# Patient Record
Sex: Female | Born: 1950 | Race: White | Hispanic: No | Marital: Married | Smoking: Former smoker
Health system: Southern US, Community
[De-identification: ages and names within clinical notes are randomized; demographics above are authoritative.]

## PROBLEM LIST (undated history)

## (undated) DIAGNOSIS — I471 Supraventricular tachycardia, unspecified: Secondary | ICD-10-CM

## (undated) DIAGNOSIS — R51 Headache: Secondary | ICD-10-CM

## (undated) DIAGNOSIS — G8929 Other chronic pain: Secondary | ICD-10-CM

## (undated) DIAGNOSIS — K5900 Constipation, unspecified: Secondary | ICD-10-CM

## (undated) DIAGNOSIS — G894 Chronic pain syndrome: Secondary | ICD-10-CM

## (undated) DIAGNOSIS — R112 Nausea with vomiting, unspecified: Secondary | ICD-10-CM

## (undated) DIAGNOSIS — C801 Malignant (primary) neoplasm, unspecified: Secondary | ICD-10-CM

## (undated) DIAGNOSIS — M545 Low back pain, unspecified: Secondary | ICD-10-CM

## (undated) DIAGNOSIS — T4145XA Adverse effect of unspecified anesthetic, initial encounter: Secondary | ICD-10-CM

## (undated) DIAGNOSIS — E079 Disorder of thyroid, unspecified: Secondary | ICD-10-CM

## (undated) DIAGNOSIS — E785 Hyperlipidemia, unspecified: Secondary | ICD-10-CM

## (undated) DIAGNOSIS — I10 Essential (primary) hypertension: Secondary | ICD-10-CM

## (undated) DIAGNOSIS — T7840XA Allergy, unspecified, initial encounter: Secondary | ICD-10-CM

## (undated) DIAGNOSIS — R918 Other nonspecific abnormal finding of lung field: Secondary | ICD-10-CM

## (undated) DIAGNOSIS — K219 Gastro-esophageal reflux disease without esophagitis: Secondary | ICD-10-CM

## (undated) DIAGNOSIS — G4700 Insomnia, unspecified: Secondary | ICD-10-CM

## (undated) DIAGNOSIS — T8859XA Other complications of anesthesia, initial encounter: Secondary | ICD-10-CM

## (undated) DIAGNOSIS — Z9889 Other specified postprocedural states: Secondary | ICD-10-CM

## (undated) DIAGNOSIS — K589 Irritable bowel syndrome without diarrhea: Secondary | ICD-10-CM

## (undated) DIAGNOSIS — K649 Unspecified hemorrhoids: Secondary | ICD-10-CM

## (undated) DIAGNOSIS — Z8489 Family history of other specified conditions: Secondary | ICD-10-CM

## (undated) HISTORY — DX: Other chronic pain: G89.29

## (undated) HISTORY — DX: Irritable bowel syndrome, unspecified: K58.9

## (undated) HISTORY — DX: Supraventricular tachycardia: I47.1

## (undated) HISTORY — PX: TONSILLECTOMY: SUR1361

## (undated) HISTORY — PX: KNEE ARTHROSCOPY: SUR90

## (undated) HISTORY — PX: DILATION AND CURETTAGE OF UTERUS: SHX78

## (undated) HISTORY — DX: Insomnia, unspecified: G47.00

## (undated) HISTORY — DX: Chronic pain syndrome: G89.4

## (undated) HISTORY — DX: Malignant (primary) neoplasm, unspecified: C80.1

## (undated) HISTORY — DX: Headache: R51

## (undated) HISTORY — DX: Disorder of thyroid, unspecified: E07.9

## (undated) HISTORY — DX: Hypercalcemia: E83.52

## (undated) HISTORY — DX: Allergy, unspecified, initial encounter: T78.40XA

## (undated) HISTORY — DX: Low back pain: M54.5

## (undated) HISTORY — PX: OTHER SURGICAL HISTORY: SHX169

## (undated) HISTORY — DX: Essential (primary) hypertension: I10

## (undated) HISTORY — DX: Hyperlipidemia, unspecified: E78.5

## (undated) HISTORY — DX: Other nonspecific abnormal finding of lung field: R91.8

## (undated) HISTORY — DX: Supraventricular tachycardia, unspecified: I47.10

## (undated) HISTORY — DX: Low back pain, unspecified: M54.50

---

## 2010-04-29 ENCOUNTER — Emergency Department (HOSPITAL_COMMUNITY): Admission: EM | Admit: 2010-04-29 | Discharge: 2010-04-29 | Payer: Self-pay | Admitting: Emergency Medicine

## 2010-06-16 DIAGNOSIS — J069 Acute upper respiratory infection, unspecified: Secondary | ICD-10-CM | POA: Insufficient documentation

## 2010-06-16 DIAGNOSIS — M545 Low back pain, unspecified: Secondary | ICD-10-CM | POA: Insufficient documentation

## 2011-08-21 ENCOUNTER — Emergency Department (HOSPITAL_COMMUNITY)
Admission: EM | Admit: 2011-08-21 | Discharge: 2011-08-21 | Disposition: A | Discharge status: Home / Self Care | Payer: No Typology Code available for payment source | Attending: Emergency Medicine | Admitting: Emergency Medicine

## 2011-08-21 DIAGNOSIS — Z79899 Other long term (current) drug therapy: Secondary | ICD-10-CM | POA: Insufficient documentation

## 2011-08-21 DIAGNOSIS — W19XXXA Unspecified fall, initial encounter: Secondary | ICD-10-CM | POA: Insufficient documentation

## 2011-08-21 DIAGNOSIS — M549 Dorsalgia, unspecified: Secondary | ICD-10-CM | POA: Insufficient documentation

## 2011-08-21 DIAGNOSIS — M25519 Pain in unspecified shoulder: Secondary | ICD-10-CM | POA: Insufficient documentation

## 2011-08-21 DIAGNOSIS — Y9229 Other specified public building as the place of occurrence of the external cause: Secondary | ICD-10-CM | POA: Insufficient documentation

## 2011-08-21 DIAGNOSIS — T148XXA Other injury of unspecified body region, initial encounter: Secondary | ICD-10-CM

## 2011-08-21 MED ORDER — HYDROCODONE-ACETAMINOPHEN 5-325 MG PO TABS
1.0000 | ORAL_TABLET | Freq: Once | ORAL | Status: AC
  Administered 2011-08-21: 1 via ORAL
  Filled 2011-08-21: qty 1

## 2011-08-21 MED ORDER — HYDROCODONE-ACETAMINOPHEN 5-325 MG PO TABS: 1.0000 | ORAL_TABLET | ORAL | Status: AC | PRN

## 2011-08-21 MED ORDER — IBUPROFEN 800 MG PO TABS
800.0000 mg | ORAL_TABLET | Freq: Once | ORAL | Status: AC
  Administered 2011-08-21: 800 mg via ORAL
  Filled 2011-08-21: qty 1

## 2011-08-21 MED ORDER — IBUPROFEN 400 MG PO TABS: 400.0000 mg | ORAL_TABLET | Freq: Three times a day (TID) | ORAL | Status: AC | PRN

## 2011-08-21 MED ORDER — DIAZEPAM 5 MG PO TABS: 5.0000 mg | ORAL_TABLET | Freq: Three times a day (TID) | ORAL | Status: AC | PRN

## 2011-08-21 MED ORDER — DIAZEPAM 5 MG PO TABS
5.0000 mg | ORAL_TABLET | Freq: Once | ORAL | Status: AC
  Administered 2011-08-21: 5 mg via ORAL
  Filled 2011-08-21: qty 1

## 2011-08-21 NOTE — ED Notes (Signed)
C/O soreness in lower back and neck.  She denies N/V and denies CP/SOB

## 2011-08-21 NOTE — ED Provider Notes (Signed)
History     CSN: [id number]  Arrival date & time 08/21/11  2028   None     No chief complaint on file.   (Consider location/radiation/quality/duration/timing/severity/associated sxs/prior treatment) Patient is a 60 y.o. female presenting with fall. The history is provided by the patient.  Fall The accident occurred 3 to 5 hours ago. The fall occurred while walking. She fell from a height of 3 to 5 ft.   patient reports she was shopping tonight cough no and a large pallet of diapers fell and pushed her down. Patient states fell on her back but felt on a soft surface. Denies any trauma to her head or lost consciousness. Complaining of generalized back and lateral shoulder pain.  No past medical history on file.  No past surgical history on file.  No family history on file.  History  Substance Use Topics  . Smoking status: Not on file  . Smokeless tobacco: Not on file  . Alcohol Use: Not on file    OB History    No data available      Review of Systems  Constitutional: Negative.   HENT: Negative.   Eyes: Negative.   Respiratory: Negative.   Cardiovascular: Negative.   Gastrointestinal: Negative.   Genitourinary: Negative.   Musculoskeletal: Negative.   Skin: Negative.   Neurological: Negative.   Hematological: Negative.   Psychiatric/Behavioral: Negative.     Allergies  Penicillins  Home Medications   Current Outpatient Rx  Name Route Sig Dispense Refill  . ASPIRIN-ACETAMINOPHEN-CAFFEINE 250-250-65 MG PO TABS Oral Take 1 tablet by mouth every 6 (six) hours as needed. For headache     . BIOTIN PO Oral Take 1 tablet by mouth daily.      Marland Kitchen VITAMIN B-12 PO Oral Take 1 tablet by mouth daily.      . OMEGA-3 FATTY ACIDS 1000 MG PO CAPS Oral Take 1 g by mouth daily.      Marland Kitchen GLUCOSAMINE PO Oral Take 1 tablet by mouth daily.      Marland Kitchen HYDROCODONE-ACETAMINOPHEN 5-325 MG PO TABS Oral Take 1 tablet by mouth every 6 (six) hours as needed. For pain     . ADULT MULTIVITAMIN  W/MINERALS CH Oral Take 1 tablet by mouth daily.      Marland Kitchen VITAMIN C 500 MG PO TABS Oral Take 500 mg by mouth daily.      Marland Kitchen ZOLPIDEM TARTRATE 10 MG PO TABS Oral Take 10 mg by mouth at bedtime as needed. For sleep       BP 169/73  Pulse 71  Temp(Src) 96.7 F (35.9 C) (Oral)  Resp 18  SpO2 96%  Physical Exam  Constitutional: She is oriented to person, place, and time. She appears well-developed and well-nourished.  HENT:  Head: Normocephalic and atraumatic.  Eyes: Conjunctivae are normal.  Neck: Neck supple.  Cardiovascular: Normal rate and regular rhythm.   Pulmonary/Chest: Effort normal and breath sounds normal.  Abdominal: Soft. Bowel sounds are normal.  Musculoskeletal: Normal range of motion.       Cervical back: She exhibits normal range of motion, no tenderness and no bony tenderness.       Thoracic back: She exhibits no tenderness and no bony tenderness.       Lumbar back: She exhibits no tenderness and no bony tenderness.       Arms: Neurological: She is alert and oriented to person, place, and time.  Skin: Skin is warm and dry. No erythema.  Psychiatric: She has a  normal mood and affect.    ED Course  Procedures findings and clinical impression discussed with patient. I will plan to discharge patient home with treatment for muscle strain and encourage followup with her primary care physician in the next week. Patient is agreeable with plan.  Labs Reviewed - No data to display No results found.   No diagnosis found.    MDM  Muscle strain status post fall.        Leanne Chang, NP 08/21/11 2204

## 2011-08-21 NOTE — ED Notes (Signed)
With the help of NP and tech pt was log rolled and long backboard removed.

## 2011-08-21 NOTE — ED Provider Notes (Signed)
Medical screening examination/treatment/procedure(s) were performed by non-physician practitioner and as supervising physician I was immediately available for consultation/collaboration.   Fayne Mcguffee A Blossom Crume, MD 08/21/11 2327 

## 2011-08-21 NOTE — ED Notes (Signed)
Family at bedside. 

## 2012-04-14 ENCOUNTER — Other Ambulatory Visit: Payer: Self-pay | Admitting: *Deleted

## 2012-04-14 DIAGNOSIS — Z1231 Encounter for screening mammogram for malignant neoplasm of breast: Secondary | ICD-10-CM

## 2012-04-28 ENCOUNTER — Other Ambulatory Visit: Payer: Self-pay | Admitting: Obstetrics and Gynecology

## 2012-04-28 ENCOUNTER — Ambulatory Visit
Admission: RE | Admit: 2012-04-28 | Discharge: 2012-04-28 | Disposition: A | Discharge status: Home / Self Care | Payer: BC Managed Care – PPO | Source: Ambulatory Visit | Attending: *Deleted | Admitting: *Deleted

## 2012-04-28 DIAGNOSIS — Z1231 Encounter for screening mammogram for malignant neoplasm of breast: Secondary | ICD-10-CM

## 2012-07-11 ENCOUNTER — Ambulatory Visit
Admission: RE | Admit: 2012-07-11 | Discharge: 2012-07-11 | Disposition: A | Discharge status: Home / Self Care | Payer: 59 | Source: Ambulatory Visit

## 2012-07-11 ENCOUNTER — Other Ambulatory Visit: Payer: Self-pay

## 2012-07-11 DIAGNOSIS — Z01811 Encounter for preprocedural respiratory examination: Secondary | ICD-10-CM

## 2012-07-21 ENCOUNTER — Other Ambulatory Visit: Payer: Self-pay | Admitting: *Deleted

## 2012-07-21 ENCOUNTER — Ambulatory Visit
Admission: RE | Admit: 2012-07-21 | Discharge: 2012-07-21 | Disposition: A | Discharge status: Home / Self Care | Payer: 59 | Source: Ambulatory Visit | Attending: *Deleted | Admitting: *Deleted

## 2012-07-21 DIAGNOSIS — R911 Solitary pulmonary nodule: Secondary | ICD-10-CM

## 2012-08-17 ENCOUNTER — Other Ambulatory Visit: Payer: Self-pay | Admitting: Neurosurgery

## 2012-08-17 DIAGNOSIS — M5410 Radiculopathy, site unspecified: Secondary | ICD-10-CM

## 2012-08-17 DIAGNOSIS — M549 Dorsalgia, unspecified: Secondary | ICD-10-CM

## 2012-08-17 DIAGNOSIS — M542 Cervicalgia: Secondary | ICD-10-CM

## 2012-09-02 ENCOUNTER — Inpatient Hospital Stay
Admission: RE | Admit: 2012-09-02 | Discharge: 2012-09-02 | Disposition: A | Discharge status: Home / Self Care | Payer: Self-pay | Source: Ambulatory Visit | Attending: Neurosurgery | Admitting: Neurosurgery

## 2012-09-02 ENCOUNTER — Ambulatory Visit
Admission: RE | Admit: 2012-09-02 | Discharge: 2012-09-02 | Disposition: A | Discharge status: Home / Self Care | Payer: 59 | Source: Ambulatory Visit | Attending: Neurosurgery | Admitting: Neurosurgery

## 2012-09-02 ENCOUNTER — Other Ambulatory Visit: Payer: Self-pay | Admitting: Neurosurgery

## 2012-09-02 VITALS — BP 200/85 | HR 58

## 2012-09-02 DIAGNOSIS — R52 Pain, unspecified: Secondary | ICD-10-CM

## 2012-09-02 DIAGNOSIS — M549 Dorsalgia, unspecified: Secondary | ICD-10-CM

## 2012-09-02 DIAGNOSIS — M5410 Radiculopathy, site unspecified: Secondary | ICD-10-CM

## 2012-09-02 DIAGNOSIS — M542 Cervicalgia: Secondary | ICD-10-CM

## 2012-09-02 MED ORDER — IOHEXOL 300 MG/ML  SOLN
10.0000 mL | Freq: Once | INTRAMUSCULAR | Status: AC | PRN
  Administered 2012-09-02: 10 mL via INTRATHECAL

## 2012-09-02 MED ORDER — ONDANSETRON HCL 4 MG/2ML IJ SOLN
4.0000 mg | Freq: Once | INTRAMUSCULAR | Status: AC
  Administered 2012-09-02: 4 mg via INTRAMUSCULAR

## 2012-09-02 MED ORDER — DIAZEPAM 5 MG PO TABS
10.0000 mg | ORAL_TABLET | Freq: Once | ORAL | Status: AC
  Administered 2012-09-02: 10 mg via ORAL

## 2012-09-02 MED ORDER — MEPERIDINE HCL 100 MG/ML IJ SOLN
75.0000 mg | Freq: Once | INTRAMUSCULAR | Status: AC
  Administered 2012-09-02: 75 mg via INTRAMUSCULAR

## 2012-09-02 MED ORDER — ONDANSETRON HCL 4 MG/2ML IJ SOLN: 4.0000 mg | Freq: Four times a day (QID) | INTRAMUSCULAR | Status: DC | PRN

## 2012-09-02 NOTE — Progress Notes (Signed)
Patient states she took last dose of Trazadone on Monday, August 29, 2012.  Donell Sievert, RN

## 2012-09-21 ENCOUNTER — Other Ambulatory Visit: Payer: Self-pay

## 2012-09-21 DIAGNOSIS — E049 Nontoxic goiter, unspecified: Secondary | ICD-10-CM

## 2012-09-23 ENCOUNTER — Ambulatory Visit
Admission: RE | Admit: 2012-09-23 | Discharge: 2012-09-23 | Disposition: A | Discharge status: Home / Self Care | Payer: 59 | Source: Ambulatory Visit

## 2012-09-23 DIAGNOSIS — E049 Nontoxic goiter, unspecified: Secondary | ICD-10-CM

## 2012-10-11 ENCOUNTER — Encounter (INDEPENDENT_AMBULATORY_CARE_PROVIDER_SITE_OTHER): Payer: Self-pay

## 2012-10-20 ENCOUNTER — Other Ambulatory Visit (INDEPENDENT_AMBULATORY_CARE_PROVIDER_SITE_OTHER): Payer: Self-pay | Admitting: General Surgery

## 2012-10-20 ENCOUNTER — Ambulatory Visit (INDEPENDENT_AMBULATORY_CARE_PROVIDER_SITE_OTHER): Payer: 59 | Admitting: General Surgery

## 2012-10-20 ENCOUNTER — Encounter (INDEPENDENT_AMBULATORY_CARE_PROVIDER_SITE_OTHER): Payer: Self-pay | Admitting: General Surgery

## 2012-10-20 ENCOUNTER — Encounter (INDEPENDENT_AMBULATORY_CARE_PROVIDER_SITE_OTHER): Payer: Self-pay

## 2012-10-20 VITALS — BP 140/80 | HR 68 | Temp 99.8°F | Resp 18 | Ht 68.0 in | Wt 189.2 lb

## 2012-10-20 DIAGNOSIS — E042 Nontoxic multinodular goiter: Secondary | ICD-10-CM

## 2012-10-20 NOTE — Progress Notes (Signed)
Patient ID: Brenda Haas, female   DOB: August 20, 1951, 62 y.o.   MRN: [id number]  Chief Complaint  Patient presents with  . New Evaluation    eval thyroid goiter    HPI Brenda Haas is a 62 y.o. female.   HPI  She is referred by [name] for evaluation of a thyroid goiter which was found incidentally on a cervical myelogram.  She then underwent an ultrasound which demonstratedA multinodular disorder. There 2 dominant nodules one in the mid lobe of the right side measuring 2.6 cm and a second in the lower pole of the right lobe measuring 1.6 cm.  She is asymptomatic from this. No hypo-or hyperthyroid symptoms. No dysphagia. No family history of thyroid cancer.  She reports that her TSH levels have been normal.  Past Medical History  Diagnosis Date  . Hyperlipidemia     borderline  . Supraventricular tachycardia, paroxysmal   . Irritable bowel syndrome (IBS)   . Spastic colon   . Insomnia   . Hypercalcemia   . Multiple lung nodules     CT 2009  . Cancer     h/o squamous cell carcinoma  . Thyroid disease     goiter and right thyroid nodule  . Chronic headaches     controlled w/ fioricet  . Allergy   . Chronic low back pain   . Chronic pain disorder     Past Surgical History  Procedure Laterality Date  . Knee arthroscopy      Family History  Problem Relation Age of Onset  . Cancer Mother     Bladder  . Heart attack Father   . Cancer Maternal Aunt     Lung  . Cancer Maternal Grandfather     Prostate    Social History History  Substance Use Topics  . Smoking status: Former Smoker    Quit date: 08/31/2004  . Smokeless tobacco: Never Used  . Alcohol Use: Yes     Comment: Occasional.    Allergies  Allergen Reactions  . Penicillins Swelling    Current Outpatient Prescriptions  Medication Sig Dispense Refill  . aspirin-acetaminophen-caffeine (EXCEDRIN MIGRAINE) 250-250-65 MG per tablet Take 1 tablet by mouth every 6 (six) hours as needed. For headache        . BIOTIN PO Take 1 tablet by mouth daily.        . Calcium Carbonate (CALTRATE 600 PO) Take by mouth. 1500 mg 1 qd w/ food      . celecoxib (CELEBREX) 200 MG capsule Take 200 mg by mouth as needed for pain.      . clobetasol cream (TEMOVATE) 0.05 % Apply topically daily. Apply to affected area once daily prn.      . Cyanocobalamin (VITAMIN B-12 PO) Take 1 tablet by mouth daily.        . cyclobenzaprine (FLEXERIL) 10 MG tablet Take 10 mg by mouth 3 (three) times daily as needed for muscle spasms.      . diazepam (VALIUM) 5 MG tablet Take 5 mg by mouth every 6 (six) hours as needed for anxiety.      . diclofenac sodium (VOLTAREN) 1 % GEL Apply 4 g topically. Apply to lower extremity joint prn.      . fish oil-omega-3 fatty acids 1000 MG capsule Take 1 g by mouth daily.        Marland Kitchen FLUOCINOLONE ACETONIDE BODY 0.01 % external oil       . gabapentin (NEURONTIN) 800 MG tablet  Take 800 mg by mouth 3 (three) times daily.      Marland Kitchen GLUCOSAMINE PO Take 1 tablet by mouth daily.        Marland Kitchen HYDROcodone-acetaminophen (NORCO) 5-325 MG per tablet Take 1 tablet by mouth every 6 (six) hours as needed. For pain       . ketoconazole (NIZORAL) 2 % shampoo       . lidocaine (LIDODERM) 5 % Place 1 patch onto the skin every 12 (twelve) hours. Remove & Discard patch within 12 hours or as directed by MD      . Multiple Vitamin (MULITIVITAMIN WITH MINERALS) TABS Take 1 tablet by mouth daily.        . TRAZODONE HCL PO Take 100 mg by mouth at bedtime as needed.      . vitamin C (ASCORBIC ACID) 500 MG tablet Take 500 mg by mouth daily.        Marland Kitchen zolpidem (AMBIEN) 10 MG tablet Take 10 mg by mouth at bedtime as needed. For sleep        No current facility-administered medications for this visit.    Review of Systems Review of Systems  Constitutional: Negative.   HENT: Negative.   Respiratory: Negative.   Cardiovascular: Negative.   Gastrointestinal: Positive for constipation.  Musculoskeletal: Positive for back pain  (Severe).    Blood pressure 140/80, pulse 68, temperature 99.8 F (37.7 C), temperature source Temporal, resp. rate 18, height 5\' 8"  (1.727 m), weight 189 lb 3.2 oz (85.821 kg).  Physical Exam Physical Exam  Constitutional: No distress.  Overweight female.  HENT:  Head: Normocephalic and atraumatic.  Eyes: EOM are normal. No scleral icterus.  Neck: Neck supple. Thyromegaly (right greater than left) present.  Cardiovascular: Normal rate and regular rhythm.   Pulmonary/Chest: Effort normal and breath sounds normal.    Data Reviewed Cervical myelogram report. Ultrasound report.  Assessment    Multinodular goiter with 2 dominant nodules in in the right lobe. She is asymptomatic from this. Her biggest complaint is the severe, debilitating back pain. She reports that she's going to have surgery on this by Dr. Jeral Fruit in the future.     Plan    Arrange for ultrasound-guided fine-needle aspiration of both dominant nodules. As far as I am concerned, she can proceed with her back surgery unless the FNA shows malignant features and then she should have thyroidectomy prior to back surgery.        [name] J 10/20/2012, 4:45 PM

## 2012-10-20 NOTE — Patient Instructions (Signed)
You can schedule your back surgery. We will work on scheduling the needle biopsy of the thyroid nodules.

## 2012-10-21 ENCOUNTER — Other Ambulatory Visit: Payer: Self-pay | Admitting: Neurosurgery

## 2012-10-21 ENCOUNTER — Encounter (HOSPITAL_COMMUNITY): Payer: Self-pay | Admitting: Pharmacy Technician

## 2012-10-21 ENCOUNTER — Telehealth (INDEPENDENT_AMBULATORY_CARE_PROVIDER_SITE_OTHER): Payer: Self-pay | Admitting: General Surgery

## 2012-10-21 NOTE — Telephone Encounter (Signed)
Pt called to say she is scheduled for back surgery next week with Dr. Jeral Fruit Is it ok for her to wait until after surgery to have her thyroid FNA done? I reviewed this with Dr. Abbey Chatters and he said it was ok for pt to wait until after back surgery to schedule her thyroid FNA. I notified pt and she will call back to office after her surgery to schedule her thyroid FNA. gy back surgery scheduled for 10-26-12/

## 2012-10-25 ENCOUNTER — Encounter (HOSPITAL_COMMUNITY): Payer: Self-pay | Admitting: *Deleted

## 2012-10-25 MED ORDER — VANCOMYCIN HCL IN DEXTROSE 1-5 GM/200ML-% IV SOLN
1000.0000 mg | INTRAVENOUS | Status: AC
  Administered 2012-10-26: 1000 mg via INTRAVENOUS
  Filled 2012-10-25: qty 200

## 2012-10-25 NOTE — Progress Notes (Signed)
Pt was seen by Dr Verdis Prime in Nov for  Surgical release. I requested EKG and clearance.  Pt had a stress test in [place] at Dr Jonny Ruiz Venditto's office, I faxed a request for this.

## 2012-10-26 ENCOUNTER — Encounter (HOSPITAL_COMMUNITY): Payer: Self-pay | Admitting: Anesthesiology

## 2012-10-26 ENCOUNTER — Inpatient Hospital Stay (HOSPITAL_COMMUNITY)
Admission: RE | Admit: 2012-10-26 | Discharge: 2012-11-10 | DRG: 884 | Disposition: A | Discharge status: Home Health | Payer: BC Managed Care – PPO | Source: Ambulatory Visit | Attending: Neurosurgery | Admitting: Neurosurgery

## 2012-10-26 ENCOUNTER — Ambulatory Visit (HOSPITAL_COMMUNITY): Payer: BC Managed Care – PPO | Admitting: Anesthesiology

## 2012-10-26 ENCOUNTER — Ambulatory Visit (HOSPITAL_COMMUNITY): Payer: BC Managed Care – PPO

## 2012-10-26 ENCOUNTER — Encounter (HOSPITAL_COMMUNITY)
Admission: RE | Disposition: A | Discharge status: Home Health | Payer: Self-pay | Source: Ambulatory Visit | Attending: Neurosurgery

## 2012-10-26 ENCOUNTER — Encounter (HOSPITAL_COMMUNITY): Payer: Self-pay | Admitting: Surgery

## 2012-10-26 DIAGNOSIS — R338 Other retention of urine: Secondary | ICD-10-CM | POA: Diagnosis not present

## 2012-10-26 DIAGNOSIS — N393 Stress incontinence (female) (male): Secondary | ICD-10-CM | POA: Diagnosis present

## 2012-10-26 DIAGNOSIS — E041 Nontoxic single thyroid nodule: Secondary | ICD-10-CM | POA: Diagnosis present

## 2012-10-26 DIAGNOSIS — E785 Hyperlipidemia, unspecified: Secondary | ICD-10-CM | POA: Diagnosis present

## 2012-10-26 DIAGNOSIS — K5900 Constipation, unspecified: Secondary | ICD-10-CM | POA: Diagnosis not present

## 2012-10-26 DIAGNOSIS — Z87891 Personal history of nicotine dependence: Secondary | ICD-10-CM

## 2012-10-26 DIAGNOSIS — R3911 Hesitancy of micturition: Secondary | ICD-10-CM | POA: Diagnosis present

## 2012-10-26 DIAGNOSIS — L988 Other specified disorders of the skin and subcutaneous tissue: Secondary | ICD-10-CM | POA: Diagnosis not present

## 2012-10-26 DIAGNOSIS — K219 Gastro-esophageal reflux disease without esophagitis: Secondary | ICD-10-CM | POA: Diagnosis present

## 2012-10-26 DIAGNOSIS — M5030 Other cervical disc degeneration, unspecified cervical region: Secondary | ICD-10-CM | POA: Diagnosis present

## 2012-10-26 DIAGNOSIS — M4150 Other secondary scoliosis, site unspecified: Secondary | ICD-10-CM | POA: Diagnosis present

## 2012-10-26 DIAGNOSIS — K589 Irritable bowel syndrome without diarrhea: Secondary | ICD-10-CM | POA: Diagnosis present

## 2012-10-26 DIAGNOSIS — G4700 Insomnia, unspecified: Secondary | ICD-10-CM | POA: Diagnosis present

## 2012-10-26 DIAGNOSIS — M5106 Intervertebral disc disorders with myelopathy, lumbar region: Principal | ICD-10-CM | POA: Diagnosis present

## 2012-10-26 DIAGNOSIS — R51 Headache: Secondary | ICD-10-CM | POA: Diagnosis present

## 2012-10-26 DIAGNOSIS — Z880 Allergy status to penicillin: Secondary | ICD-10-CM

## 2012-10-26 DIAGNOSIS — R351 Nocturia: Secondary | ICD-10-CM | POA: Diagnosis present

## 2012-10-26 DIAGNOSIS — Z791 Long term (current) use of non-steroidal anti-inflammatories (NSAID): Secondary | ICD-10-CM

## 2012-10-26 HISTORY — DX: Family history of other specified conditions: Z84.89

## 2012-10-26 HISTORY — DX: Other complications of anesthesia, initial encounter: T88.59XA

## 2012-10-26 HISTORY — PX: POSTERIOR LUMBAR FUSION 4 LEVEL: SHX6037

## 2012-10-26 HISTORY — DX: Constipation, unspecified: K59.00

## 2012-10-26 HISTORY — DX: Adverse effect of unspecified anesthetic, initial encounter: T41.45XA

## 2012-10-26 HISTORY — DX: Other specified postprocedural states: R11.2

## 2012-10-26 HISTORY — DX: Unspecified hemorrhoids: K64.9

## 2012-10-26 HISTORY — DX: Gastro-esophageal reflux disease without esophagitis: K21.9

## 2012-10-26 HISTORY — DX: Other specified postprocedural states: Z98.890

## 2012-10-26 LAB — POCT I-STAT 4, (NA,K, GLUC, HGB,HCT)
HCT: 28 % — ABNORMAL LOW (ref 36.0–46.0)
Hemoglobin: 9.5 g/dL — ABNORMAL LOW (ref 12.0–15.0)
Potassium: 3.7 mEq/L (ref 3.5–5.1)
Sodium: 141 mEq/L (ref 135–145)
Sodium: 141 mEq/L (ref 135–145)

## 2012-10-26 LAB — SURGICAL PCR SCREEN
MRSA, PCR: NEGATIVE
Staphylococcus aureus: NEGATIVE

## 2012-10-26 SURGERY — POSTERIOR LUMBAR FUSION 4 LEVEL
Anesthesia: General | Site: Spine Lumbar | Wound class: Clean

## 2012-10-26 MED ORDER — VECURONIUM BROMIDE 10 MG IV SOLR: INTRAVENOUS | Status: DC | PRN

## 2012-10-26 MED ORDER — OXYCODONE-ACETAMINOPHEN 5-325 MG PO TABS
1.0000 | ORAL_TABLET | ORAL | Status: DC | PRN
  Administered 2012-10-27 – 2012-10-31 (×11): 2 via ORAL
  Administered 2012-11-01 (×2): 1 via ORAL
  Administered 2012-11-01 – 2012-11-04 (×11): 2 via ORAL
  Administered 2012-11-04: 1 via ORAL
  Administered 2012-11-04: 2 via ORAL
  Administered 2012-11-04: 1 via ORAL
  Administered 2012-11-05 – 2012-11-10 (×21): 2 via ORAL
  Filled 2012-10-26 (×18): qty 2
  Filled 2012-10-26: qty 1
  Filled 2012-10-26 (×4): qty 2
  Filled 2012-10-26: qty 1
  Filled 2012-10-26 (×3): qty 2
  Filled 2012-10-26: qty 1
  Filled 2012-10-26 (×4): qty 2
  Filled 2012-10-26: qty 1
  Filled 2012-10-26 (×17): qty 2

## 2012-10-26 MED ORDER — HYDROMORPHONE HCL PF 1 MG/ML IJ SOLN
INTRAMUSCULAR | Status: AC
  Filled 2012-10-26: qty 1

## 2012-10-26 MED ORDER — ROCURONIUM BROMIDE 100 MG/10ML IV SOLN
INTRAVENOUS | Status: DC | PRN
  Administered 2012-10-26: 50 mg via INTRAVENOUS
  Administered 2012-10-26: 10 mg via INTRAVENOUS
  Administered 2012-10-26 (×2): 20 mg via INTRAVENOUS

## 2012-10-26 MED ORDER — BUPIVACAINE LIPOSOME 1.3 % IJ SUSP
INTRAMUSCULAR | Status: DC | PRN
  Administered 2012-10-26: 20 mL

## 2012-10-26 MED ORDER — OXYCODONE HCL 5 MG/5ML PO SOLN: 5.0000 mg | Freq: Once | ORAL | Status: DC | PRN

## 2012-10-26 MED ORDER — SODIUM CHLORIDE 0.9 % IV SOLN: 250.0000 mL | INTRAVENOUS | Status: DC

## 2012-10-26 MED ORDER — ONDANSETRON HCL 4 MG/2ML IJ SOLN: 4.0000 mg | Freq: Once | INTRAMUSCULAR | Status: AC | PRN

## 2012-10-26 MED ORDER — DIPHENHYDRAMINE HCL 12.5 MG/5ML PO ELIX: 12.5000 mg | ORAL_SOLUTION | Freq: Four times a day (QID) | ORAL | Status: DC | PRN

## 2012-10-26 MED ORDER — HYDROMORPHONE HCL PF 1 MG/ML IJ SOLN
INTRAMUSCULAR | Status: AC
Start: 2012-10-26 — End: 2012-10-26
  Administered 2012-10-26: 0.5 mg via INTRAVENOUS
  Filled 2012-10-26: qty 1

## 2012-10-26 MED ORDER — MUPIROCIN 2 % EX OINT
TOPICAL_OINTMENT | Freq: Two times a day (BID) | CUTANEOUS | Status: DC
  Administered 2012-10-26 – 2012-11-09 (×26): via NASAL
  Filled 2012-10-26 (×2): qty 22

## 2012-10-26 MED ORDER — SODIUM CHLORIDE 0.9 % IV SOLN
INTRAVENOUS | Status: DC | PRN
  Administered 2012-10-26: 14:00:00 via INTRAVENOUS

## 2012-10-26 MED ORDER — DIPHENHYDRAMINE HCL 50 MG/ML IJ SOLN: 12.5000 mg | Freq: Four times a day (QID) | INTRAMUSCULAR | Status: DC | PRN

## 2012-10-26 MED ORDER — MIDAZOLAM HCL 5 MG/5ML IJ SOLN
INTRAMUSCULAR | Status: DC | PRN
  Administered 2012-10-26: 2 mg via INTRAVENOUS

## 2012-10-26 MED ORDER — OXYCODONE HCL 5 MG PO TABS: 5.0000 mg | ORAL_TABLET | Freq: Once | ORAL | Status: DC | PRN

## 2012-10-26 MED ORDER — SURGIFOAM 100 EX MISC
CUTANEOUS | Status: DC | PRN
  Administered 2012-10-26 (×2): via TOPICAL

## 2012-10-26 MED ORDER — MEPERIDINE HCL 25 MG/ML IJ SOLN: 6.2500 mg | INTRAMUSCULAR | Status: DC | PRN

## 2012-10-26 MED ORDER — SODIUM CHLORIDE 0.9 % IJ SOLN
3.0000 mL | INTRAMUSCULAR | Status: DC | PRN
  Administered 2012-10-29: 3 mL via INTRAVENOUS

## 2012-10-26 MED ORDER — LIDOCAINE HCL (CARDIAC) 20 MG/ML IV SOLN
INTRAVENOUS | Status: DC | PRN
  Administered 2012-10-26: 100 mg via INTRAVENOUS

## 2012-10-26 MED ORDER — SODIUM CHLORIDE 0.9 % IJ SOLN
3.0000 mL | Freq: Two times a day (BID) | INTRAMUSCULAR | Status: DC
  Administered 2012-10-28 – 2012-11-10 (×17): 3 mL via INTRAVENOUS

## 2012-10-26 MED ORDER — NALOXONE HCL 0.4 MG/ML IJ SOLN: 0.4000 mg | INTRAMUSCULAR | Status: DC | PRN

## 2012-10-26 MED ORDER — SODIUM CHLORIDE 0.9 % IV SOLN
INTRAVENOUS | Status: DC
  Administered 2012-10-29: 05:00:00 via INTRAVENOUS

## 2012-10-26 MED ORDER — ZOLPIDEM TARTRATE 5 MG PO TABS
5.0000 mg | ORAL_TABLET | Freq: Every evening | ORAL | Status: DC | PRN
  Administered 2012-11-04 – 2012-11-07 (×2): 5 mg via ORAL
  Filled 2012-10-26 (×2): qty 1

## 2012-10-26 MED ORDER — ACETAMINOPHEN 650 MG RE SUPP: 650.0000 mg | RECTAL | Status: DC | PRN

## 2012-10-26 MED ORDER — MENTHOL 3 MG MT LOZG
1.0000 | LOZENGE | OROMUCOSAL | Status: DC | PRN
  Administered 2012-11-01: 3 mg via ORAL
  Filled 2012-10-26: qty 9

## 2012-10-26 MED ORDER — SENNA 8.6 MG PO TABS
1.0000 | ORAL_TABLET | Freq: Every day | ORAL | Status: DC | PRN
  Administered 2012-11-01: 8.6 mg via ORAL
  Filled 2012-10-26: qty 1

## 2012-10-26 MED ORDER — ONDANSETRON HCL 4 MG/2ML IJ SOLN
INTRAMUSCULAR | Status: DC | PRN
  Administered 2012-10-26 (×2): 4 mg via INTRAVENOUS

## 2012-10-26 MED ORDER — LACTATED RINGERS IV SOLN
INTRAVENOUS | Status: DC | PRN
  Administered 2012-10-26 (×4): via INTRAVENOUS

## 2012-10-26 MED ORDER — BUPIVACAINE LIPOSOME 1.3 % IJ SUSP
20.0000 mL | INTRAMUSCULAR | Status: DC
  Filled 2012-10-26: qty 20

## 2012-10-26 MED ORDER — 0.9 % SODIUM CHLORIDE (POUR BTL) OPTIME
TOPICAL | Status: DC | PRN
  Administered 2012-10-26 (×2): 1000 mL

## 2012-10-26 MED ORDER — ONDANSETRON HCL 4 MG/2ML IJ SOLN
4.0000 mg | INTRAMUSCULAR | Status: DC | PRN
  Administered 2012-10-28 – 2012-11-02 (×2): 4 mg via INTRAVENOUS
  Filled 2012-10-26 (×3): qty 2

## 2012-10-26 MED ORDER — PHENYLEPHRINE HCL 10 MG/ML IJ SOLN
INTRAMUSCULAR | Status: DC | PRN
  Administered 2012-10-26 (×2): 80 ug via INTRAVENOUS

## 2012-10-26 MED ORDER — MORPHINE SULFATE (PF) 1 MG/ML IV SOLN
INTRAVENOUS | Status: DC
  Administered 2012-10-26: 23:00:00 via INTRAVENOUS
  Administered 2012-10-27: 9 mg via INTRAVENOUS
  Administered 2012-10-27: 21 mg via INTRAVENOUS
  Administered 2012-10-27: 09:00:00 via INTRAVENOUS
  Administered 2012-10-27: 1 mg via INTRAVENOUS
  Administered 2012-10-28: 4.5 mg via INTRAVENOUS
  Administered 2012-10-28: 1.5 mg via INTRAVENOUS
  Administered 2012-10-28: 3 mg via INTRAVENOUS
  Administered 2012-10-28: via INTRAVENOUS
  Administered 2012-10-28: 4.5 mg via INTRAVENOUS
  Administered 2012-10-29: 6 mg via INTRAVENOUS
  Administered 2012-10-29: 3 mg via INTRAVENOUS
  Filled 2012-10-26 (×5): qty 25

## 2012-10-26 MED ORDER — ONDANSETRON HCL 4 MG/2ML IJ SOLN
4.0000 mg | Freq: Four times a day (QID) | INTRAMUSCULAR | Status: DC | PRN
  Administered 2012-10-29: 4 mg via INTRAVENOUS

## 2012-10-26 MED ORDER — GABAPENTIN 800 MG PO TABS
800.0000 mg | ORAL_TABLET | Freq: Every day | ORAL | Status: DC
  Filled 2012-10-26: qty 1

## 2012-10-26 MED ORDER — MORPHINE SULFATE (PF) 1 MG/ML IV SOLN
INTRAVENOUS | Status: AC
  Filled 2012-10-26: qty 25

## 2012-10-26 MED ORDER — VANCOMYCIN 50 MG/ML ORAL SOLUTION
125.0000 mg | Freq: Four times a day (QID) | ORAL | Status: DC
  Administered 2012-10-26 – 2012-11-06 (×41): 125 mg via ORAL
  Filled 2012-10-26 (×46): qty 2.5

## 2012-10-26 MED ORDER — MUPIROCIN 2 % EX OINT
TOPICAL_OINTMENT | CUTANEOUS | Status: AC
  Filled 2012-10-26: qty 22

## 2012-10-26 MED ORDER — ARTIFICIAL TEARS OP OINT
TOPICAL_OINTMENT | OPHTHALMIC | Status: DC | PRN
  Administered 2012-10-26: 1 via OPHTHALMIC

## 2012-10-26 MED ORDER — BUPIVACAINE LIPOSOME 1.3 % IJ SUSP
20.0000 mL | Freq: Once | INTRAMUSCULAR | Status: DC
  Filled 2012-10-26: qty 20

## 2012-10-26 MED ORDER — PROPOFOL 10 MG/ML IV BOLUS
INTRAVENOUS | Status: DC | PRN
  Administered 2012-10-26: 50 mg via INTRAVENOUS
  Administered 2012-10-26: 100 mg via INTRAVENOUS

## 2012-10-26 MED ORDER — DIAZEPAM 5 MG PO TABS
5.0000 mg | ORAL_TABLET | Freq: Four times a day (QID) | ORAL | Status: DC | PRN
  Administered 2012-10-27 – 2012-11-02 (×13): 5 mg via ORAL
  Filled 2012-10-26 (×14): qty 1

## 2012-10-26 MED ORDER — SODIUM CHLORIDE 0.9 % IJ SOLN: 9.0000 mL | INTRAMUSCULAR | Status: DC | PRN

## 2012-10-26 MED ORDER — PHENOL 1.4 % MT LIQD
1.0000 | OROMUCOSAL | Status: DC | PRN
  Administered 2012-10-30: 1 via OROMUCOSAL
  Filled 2012-10-26: qty 177

## 2012-10-26 MED ORDER — EPHEDRINE SULFATE 50 MG/ML IJ SOLN
INTRAMUSCULAR | Status: DC | PRN
  Administered 2012-10-26 (×3): 10 mg via INTRAVENOUS

## 2012-10-26 MED ORDER — TRAZODONE HCL 100 MG PO TABS
100.0000 mg | ORAL_TABLET | Freq: Every day | ORAL | Status: DC
  Administered 2012-10-26 – 2012-11-09 (×15): 100 mg via ORAL
  Filled 2012-10-26 (×16): qty 1

## 2012-10-26 MED ORDER — GABAPENTIN 400 MG PO CAPS
800.0000 mg | ORAL_CAPSULE | Freq: Every day | ORAL | Status: DC
  Administered 2012-10-26 – 2012-10-27 (×2): 800 mg via ORAL
  Filled 2012-10-26 (×3): qty 2

## 2012-10-26 MED ORDER — ALBUMIN HUMAN 5 % IV SOLN
INTRAVENOUS | Status: DC | PRN
  Administered 2012-10-26 (×2): via INTRAVENOUS

## 2012-10-26 MED ORDER — HYDROMORPHONE HCL PF 1 MG/ML IJ SOLN
0.2500 mg | INTRAMUSCULAR | Status: DC | PRN
  Administered 2012-10-26 (×2): 0.5 mg via INTRAVENOUS

## 2012-10-26 MED ORDER — FENTANYL CITRATE 0.05 MG/ML IJ SOLN
INTRAMUSCULAR | Status: DC | PRN
  Administered 2012-10-26 (×2): 50 ug via INTRAVENOUS
  Administered 2012-10-26: 100 ug via INTRAVENOUS
  Administered 2012-10-26: 50 ug via INTRAVENOUS
  Administered 2012-10-26: 150 ug via INTRAVENOUS
  Administered 2012-10-26 (×2): 50 ug via INTRAVENOUS
  Administered 2012-10-26: 100 ug via INTRAVENOUS
  Administered 2012-10-26: 50 ug via INTRAVENOUS
  Administered 2012-10-26: 100 ug via INTRAVENOUS

## 2012-10-26 MED ORDER — ONDANSETRON HCL 4 MG/2ML IJ SOLN
INTRAMUSCULAR | Status: AC
  Administered 2012-10-26: 4 mg via INTRAVENOUS
  Filled 2012-10-26: qty 2

## 2012-10-26 MED ORDER — ACETAMINOPHEN 325 MG PO TABS
650.0000 mg | ORAL_TABLET | ORAL | Status: DC | PRN
  Administered 2012-10-27: 650 mg via ORAL
  Filled 2012-10-26: qty 2

## 2012-10-26 SURGICAL SUPPLY — 76 items
BENZOIN TINCTURE PRP APPL 2/3 (GAUZE/BANDAGES/DRESSINGS) ×2 IMPLANT
BLADE SURG ROTATE 9660 (MISCELLANEOUS) IMPLANT
BONE EQUIVA 10CC (Bone Implant) ×2 IMPLANT
BUR ACORN 6.0 (BURR) ×4 IMPLANT
BUR MATCHSTICK NEURO 3.0 LAGG (BURR) ×2 IMPLANT
CANISTER SUCTION 2500CC (MISCELLANEOUS) ×2 IMPLANT
CAP LOCKING REVERE (Cap) ×16 IMPLANT
CLOTH BEACON ORANGE TIMEOUT ST (SAFETY) ×2 IMPLANT
CONNECTOR VAR CROSS 5.5X48-60 (Connector) ×2 IMPLANT
CONT SPEC 4OZ CLIKSEAL STRL BL (MISCELLANEOUS) ×4 IMPLANT
COVER BACK TABLE 24X17X13 BIG (DRAPES) IMPLANT
COVER TABLE BACK 60X90 (DRAPES) ×2 IMPLANT
DRAPE C-ARM 42X72 X-RAY (DRAPES) ×6 IMPLANT
DRAPE LAPAROTOMY 100X72X124 (DRAPES) ×2 IMPLANT
DRAPE POUCH INSTRU U-SHP 10X18 (DRAPES) ×2 IMPLANT
DRSG PAD ABDOMINAL 8X10 ST (GAUZE/BANDAGES/DRESSINGS) ×2 IMPLANT
DURAFORM COLLAGEN 1X1 5-PACK (Neuro Prosthesis/Implant) ×2 IMPLANT
DURAPREP 26ML APPLICATOR (WOUND CARE) ×2 IMPLANT
ELECT REM PT RETURN 9FT ADLT (ELECTROSURGICAL) ×2
ELECTRODE REM PT RTRN 9FT ADLT (ELECTROSURGICAL) ×1 IMPLANT
EVACUATOR 1/8 PVC DRAIN (DRAIN) IMPLANT
EVACUATOR 3/16  PVC DRAIN (DRAIN) ×1
EVACUATOR 3/16 PVC DRAIN (DRAIN) ×1 IMPLANT
GAUZE SPONGE 4X4 16PLY XRAY LF (GAUZE/BANDAGES/DRESSINGS) ×2 IMPLANT
GLOVE BIO SURGEON STRL SZ8.5 (GLOVE) ×2 IMPLANT
GLOVE BIOGEL M 7.0 STRL (GLOVE) ×2 IMPLANT
GLOVE BIOGEL M 8.0 STRL (GLOVE) ×4 IMPLANT
GLOVE BIOGEL PI IND STRL 7.5 (GLOVE) ×3 IMPLANT
GLOVE BIOGEL PI IND STRL 8 (GLOVE) ×1 IMPLANT
GLOVE BIOGEL PI INDICATOR 7.5 (GLOVE) ×3
GLOVE BIOGEL PI INDICATOR 8 (GLOVE) ×1
GLOVE ECLIPSE 7.5 STRL STRAW (GLOVE) ×10 IMPLANT
GLOVE EXAM NITRILE LRG STRL (GLOVE) IMPLANT
GLOVE EXAM NITRILE MD LF STRL (GLOVE) IMPLANT
GLOVE EXAM NITRILE XL STR (GLOVE) IMPLANT
GLOVE EXAM NITRILE XS STR PU (GLOVE) IMPLANT
GLOVE SS BIOGEL STRL SZ 8 (GLOVE) ×1 IMPLANT
GLOVE SUPERSENSE BIOGEL SZ 8 (GLOVE) ×1
GLOVE SURG SS PI 7.0 STRL IVOR (GLOVE) ×2 IMPLANT
GOWN BRE IMP SLV AUR LG STRL (GOWN DISPOSABLE) ×2 IMPLANT
GOWN BRE IMP SLV AUR XL STRL (GOWN DISPOSABLE) ×6 IMPLANT
GOWN STRL REIN 2XL LVL4 (GOWN DISPOSABLE) ×2 IMPLANT
KIT BASIN OR (CUSTOM PROCEDURE TRAY) ×2 IMPLANT
KIT ROOM TURNOVER OR (KITS) ×2 IMPLANT
MILL MEDIUM DISP (BLADE) ×2 IMPLANT
NEEDLE HYPO 18GX1.5 BLUNT FILL (NEEDLE) IMPLANT
NEEDLE HYPO 21X1.5 SAFETY (NEEDLE) ×2 IMPLANT
NEEDLE HYPO 25X1 1.5 SAFETY (NEEDLE) ×2 IMPLANT
NS IRRIG 1000ML POUR BTL (IV SOLUTION) ×2 IMPLANT
PACK LAMINECTOMY NEURO (CUSTOM PROCEDURE TRAY) ×2 IMPLANT
PAD ARMBOARD 7.5X6 YLW CONV (MISCELLANEOUS) ×10 IMPLANT
PATTIES SURGICAL .5 X1 (DISPOSABLE) ×4 IMPLANT
PATTIES SURGICAL .5 X3 (DISPOSABLE) IMPLANT
ROD CURVED 125MM (Rod) ×4 IMPLANT
SCREW PEDICLE 5.5MMX55MM (Screw) ×4 IMPLANT
SCREW PEDICLE REVERE 5.5X45 (Screw) ×16 IMPLANT
SPACER SUSTAIN O SML 8X22 12MM (Spacer) ×6 IMPLANT
SPACER SUSTAIN O SML 8X22 8MM (Spacer) ×2 IMPLANT
SPONGE GAUZE 4X4 12PLY (GAUZE/BANDAGES/DRESSINGS) ×2 IMPLANT
SPONGE LAP 4X18 X RAY DECT (DISPOSABLE) IMPLANT
SPONGE NEURO XRAY DETECT 1X3 (DISPOSABLE) IMPLANT
SPONGE SURGIFOAM ABS GEL 100 (HEMOSTASIS) ×2 IMPLANT
STRIP CLOSURE SKIN 1/2X4 (GAUZE/BANDAGES/DRESSINGS) ×2 IMPLANT
SUT PROLENE 6 0 BV (SUTURE) ×2 IMPLANT
SUT VIC AB 1 CT1 18XBRD ANBCTR (SUTURE) ×3 IMPLANT
SUT VIC AB 1 CT1 8-18 (SUTURE) ×3
SUT VIC AB 2-0 CP2 18 (SUTURE) ×4 IMPLANT
SUT VIC AB 3-0 SH 8-18 (SUTURE) ×4 IMPLANT
SYR 20CC LL (SYRINGE) ×2 IMPLANT
SYR 20ML ECCENTRIC (SYRINGE) ×2 IMPLANT
SYR 5ML LL (SYRINGE) IMPLANT
TAPE CLOTH SURG 4X10 WHT LF (GAUZE/BANDAGES/DRESSINGS) ×2 IMPLANT
TOWEL OR 17X24 6PK STRL BLUE (TOWEL DISPOSABLE) ×2 IMPLANT
TOWEL OR 17X26 10 PK STRL BLUE (TOWEL DISPOSABLE) ×2 IMPLANT
TRAY FOLEY CATH 14FRSI W/METER (CATHETERS) ×2 IMPLANT
WATER STERILE IRR 1000ML POUR (IV SOLUTION) ×2 IMPLANT

## 2012-10-26 NOTE — Anesthesia Postprocedure Evaluation (Signed)
Anesthesia Post Note  Patient: Brenda Haas  Procedure(s) Performed: Procedure(s) (LRB): POSTERIOR LUMBAR FUSION 4 LEVEL (N/A)  Anesthesia type: general  Patient location: PACU  Post pain: Pain level controlled  Post assessment: Patient's Cardiovascular Status Stable  Last Vitals:  Filed Vitals:   10/26/12 1715  BP: 94/68  Pulse: 82  Temp:   Resp: 13    Post vital signs: Reviewed and stable  Level of consciousness: sedated  Complications: No apparent anesthesia complications

## 2012-10-26 NOTE — H&P (Signed)
Brenda Haas is an 62 y.o. female.   Chief Complaint: lbp HPI: patient who has been complaining of lumbar pain with radiation to the left leg for several months  Sometimes to the right leg. In the past she had conservative treatment and she was to have some type of surgery in [place]   But she was declined by the insurance. Also she has been complaining of cervical pain with radiation. Patient had an outpatient cervical and lumbar myelogra.  Past Medical History  Diagnosis Date  . Hyperlipidemia     borderline  . Supraventricular tachycardia, paroxysmal   . Irritable bowel syndrome (IBS)   . Spastic colon   . Insomnia   . Hypercalcemia   . Multiple lung nodules     CT 2009  . Cancer     h/o squamous cell carcinoma  . Thyroid disease     goiter and right thyroid nodule  . Chronic headaches     controlled w/ fioricet  . Allergy   . Chronic low back pain   . Chronic pain disorder   . Complication of anesthesia     difficulty going to sleep  . PONV (postoperative nausea and vomiting)   . Family history of anesthesia complication     Son- difficulty waking up  . Constipation   . GERD (gastroesophageal reflux disease)     did take Nexium , not needed lately    Past Surgical History  Procedure Laterality Date  . Knee arthroscopy    . Dilation and curettage of uterus    . Colonscopy    . Tonsillectomy      Family History  Problem Relation Age of Onset  . Cancer Mother     Bladder  . Heart attack Father   . Cancer Maternal Aunt     Lung  . Cancer Maternal Grandfather     Prostate   Social History:  reports that she quit smoking about 8 years ago. She has never used smokeless tobacco. She reports that  drinks alcohol. She reports that she does not use illicit drugs.  Allergies:  Allergies  Allergen Reactions  . Penicillins Swelling    Medications Prior to Admission  Medication Sig Dispense Refill  . BIOTIN PO Take 10,000 mcg by mouth daily.       . Calcium  Carbonate (CALTRATE 600 PO) Take 600 mg by mouth daily. 1500 mg 1 qd w/ food      . clobetasol cream (TEMOVATE) 0.05 % Apply 1 application topically daily as needed.       . diazepam (VALIUM) 5 MG tablet Take 5 mg by mouth at bedtime.       . gabapentin (NEURONTIN) 800 MG tablet Take 800 mg by mouth at bedtime.       Marland Kitchen glucosamine-chondroitin 500-400 MG tablet Take 1 tablet by mouth daily.      Marland Kitchen HYDROcodone-acetaminophen (LORTAB) 7.5-500 MG per tablet Take 1 tablet by mouth 2 (two) times daily as needed for pain.       Marland Kitchen ibuprofen (ADVIL,MOTRIN) 800 MG tablet Take 800 mg by mouth 2 (two) times daily as needed for pain.       Marland Kitchen lidocaine (LIDODERM) 5 % Place 1 patch onto the skin daily as needed (for pain). Remove & Discard patch within 12 hours or as directed by MD      . Multiple Vitamin (MULITIVITAMIN WITH MINERALS) TABS Take 1 tablet by mouth daily.        Marland Kitchen  Omega-3 Fatty Acids (OMEGA 3 PO) Take 1 capsule by mouth daily.      . Safflower Oil (CLA) 1000 MG CAPS Take 1,000 mg by mouth daily.      Marland Kitchen senna (SENOKOT) 8.6 MG TABS Take 2 tablets by mouth 3 (three) times daily.      . traZODone (DESYREL) 100 MG tablet Take 100 mg by mouth at bedtime.        No results found for this or any previous visit (from the past 48 hour(s)). No results found.  Review of Systems  Constitutional: Negative.   HENT: Positive for neck pain.   Eyes: Negative.   Respiratory: Negative.   Cardiovascular: Negative.   Gastrointestinal: Negative.   Genitourinary: Negative.   Musculoskeletal: Positive for back pain.  Skin: Negative.   Neurological: Positive for sensory change and focal weakness.  Endo/Heme/Allergies: Negative.   Psychiatric/Behavioral: Negative.     Blood pressure 166/82, pulse 70, temperature 98.3 F (36.8 C), temperature source Oral, resp. rate 18, SpO2 97.00%. Physical Exam hent, nl. Neck,pain with mobility. Cv, nl. Lungs, clear. Abdomen, soft. Extremities, nl. NEURO WEAKNESS of left  biceps and weakness of df and pf both feet. Dtr, nl. SLR positive at 45 degrees. Pain while walking in tiptoes and heels. 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 Also ddd in the cervical spine.   Assessment/Plan i did talk to her and later with her and her husband at length. We talked about conservative treatment, second opinion in [place] , etc but she feels that she is getting worse a prefers to go ahead with decompression and fusion from l2 to s1 instead of lefo 45, 51 foraminotomies which will be a temporary relief. Surgery was fully explained to both of them including risks and benefits  Tylin Force M 10/26/2012, 8:49 AM

## 2012-10-26 NOTE — Progress Notes (Signed)
Op note [id number]

## 2012-10-26 NOTE — Anesthesia Preprocedure Evaluation (Addendum)
Anesthesia Evaluation  Patient identified by MRN, date of birth, ID band Patient awake    Reviewed: Allergy & Precautions, H&P , NPO status , Patient's Chart, lab work & pertinent test results  History of Anesthesia Complications (+) PONV  Airway Mallampati: I TM Distance: >3 FB Neck ROM: Full    Dental  (+) Teeth Intact   Pulmonary          Cardiovascular + dysrhythmias     Neuro/Psych    GI/Hepatic GERD-  Medicated and Controlled,  Endo/Other    Renal/GU      Musculoskeletal   Abdominal   Peds  Hematology   Anesthesia Other Findings   Reproductive/Obstetrics                          Anesthesia Physical Anesthesia Plan  ASA: II  Anesthesia Plan: General   Post-op Pain Management:    Induction: Intravenous  Airway Management Planned: Oral ETT  Additional Equipment:   Intra-op Plan:   Post-operative Plan: Extubation in OR  Informed Consent: I have reviewed the patients History and Physical, chart, labs and discussed the procedure including the risks, benefits and alternatives for the proposed anesthesia with the patient or authorized representative who has indicated his/her understanding and acceptance.     Plan Discussed with: CRNA and Surgeon  Anesthesia Plan Comments:         Anesthesia Quick Evaluation

## 2012-10-26 NOTE — Preoperative (Signed)
Beta Blockers   Reason not to administer Beta Blockers:Not Applicable 

## 2012-10-26 NOTE — Transfer of Care (Signed)
Immediate Anesthesia Transfer of Care Note  Patient: Brenda Haas  Procedure(s) Performed: Procedure(s) with comments: POSTERIOR LUMBAR FUSION 4 LEVEL (N/A) - Lumbar two-three, lumbar three-four,lumbar four-five,Lumbar five-Sacral oneDiskectomy/Fusion/Cages/Pedicle screws/Posterolateral arthrodesis/Cellsaver C-Arm  Patient Location: PACU  Anesthesia Type:General  Level of Consciousness: awake  Airway & Oxygen Therapy: Patient Spontanous Breathing and Patient connected to nasal cannula oxygen  Post-op Assessment: Report given to PACU RN, Post -op Vital signs reviewed and stable and Patient moving all extremities  Post vital signs: Reviewed and stable  Complications: No apparent anesthesia complications

## 2012-10-26 NOTE — Anesthesia Procedure Notes (Signed)
Procedure Name: Intubation Date/Time: 10/26/2012 11:30 AM Performed by: Quentin Ore Pre-anesthesia Checklist: Patient identified, Emergency Drugs available, Suction available, Patient being monitored and Timeout performed Patient Re-evaluated:Patient Re-evaluated prior to inductionOxygen Delivery Method: Circle system utilized Preoxygenation: Pre-oxygenation with 100% oxygen Intubation Type: IV induction Ventilation: Mask ventilation without difficulty Laryngoscope Size: Mac and 3 Grade View: Grade III Tube type: Oral Tube size: 7.5 mm Number of attempts: 1 Airway Equipment and Method: Stylet Placement Confirmation: positive ETCO2 and breath sounds checked- equal and bilateral Secured at: 22 cm Tube secured with: Tape Dental Injury: Teeth and Oropharynx as per pre-operative assessment

## 2012-10-27 ENCOUNTER — Encounter (HOSPITAL_COMMUNITY): Payer: Self-pay | Admitting: Neurosurgery

## 2012-10-27 MED ORDER — IBUPROFEN 800 MG PO TABS
800.0000 mg | ORAL_TABLET | Freq: Four times a day (QID) | ORAL | Status: DC | PRN
  Administered 2012-10-27 – 2012-11-09 (×10): 800 mg via ORAL
  Filled 2012-10-27 (×11): qty 1

## 2012-10-27 MED ORDER — HEPARIN SODIUM (PORCINE) 5000 UNIT/ML IJ SOLN
5000.0000 [IU] | Freq: Three times a day (TID) | INTRAMUSCULAR | Status: DC
  Administered 2012-10-27 – 2012-10-28 (×3): 5000 [IU] via SUBCUTANEOUS
  Filled 2012-10-27 (×6): qty 1

## 2012-10-27 NOTE — Progress Notes (Signed)
Patient ID: Brenda Haas, female   DOB: 1950/11/19, 62 y.o.   MRN: [id number] C/o incisional pain, no weakness. Unable to sleep last night. Drain working well.pt to see

## 2012-10-27 NOTE — Progress Notes (Signed)
Orthopedic Tech Progress Note Patient Details:  Brenda Haas 1951/02/28 [id number]  Patient ID: Brenda Haas, female   DOB: [birth year], 62 y.o.   MRN: [id number]   Brenda Haas 10/27/2012, 12:53 PM CALLED [facility] FOR LUMBAR CORSET.

## 2012-10-27 NOTE — Progress Notes (Signed)
Orthopedic Tech Progress Note Patient Details:  Brenda Haas 1951-04-04 [id number]  Patient ID: Brenda Haas, female   DOB: 01-17-1951, 62 y.o.   MRN: [id number]   Shawnie Pons 10/27/2012, 3:22 PM  Lumbar corset

## 2012-10-27 NOTE — Evaluation (Signed)
Physical Therapy Evaluation Patient Details Name: Brenda Haas MRN: [id number] DOB: June 29, 1951 Today's Date: 10/27/2012 Time: 1410-1443 PT Time Calculation (min): 33 min  PT Assessment / Plan / Recommendation Clinical Impression  Pt. is s/p 4  level PLIF with significant pain limiting her participation in PT today to just rolling left side<>supine.  SHe was unable to tolerate rolling to right side due to pain.  Her lumbar corset has been ordered but has not yet arrived.  Disucssed with nursing the best technique to attempt to transfer her to 3n1 once foley removed.  Expect this pt. will progress slowly due to her current pain level  and multilevel PLIF.    PT Assessment  Patient needs continued PT services    Follow Up Recommendations  Home health PT;Supervision/Assistance - 24 hour;Supervision for mobility/OOB    Does the patient have the potential to tolerate intense rehabilitation      Barriers to Discharge None husband (currently separated) is available to assist as long as she progresses adequately    Equipment Recommendations  Rolling walker with 5" wheels    Recommendations for Other Services     Frequency Min 5X/week    Precautions / Restrictions Precautions Precautions: Back Precaution Booklet Issued: Yes (comment) Precaution Comments: instructed in log rolling and back precauations Required Braces or Orthoses: Other Brace/Splint (awaiting arrival of lumbar corset) Restrictions Weight Bearing Restrictions: No   Pertinent Vitals/Pain      Mobility  Bed Mobility Bed Mobility: Rolling Right;Rolling Left;Right Sidelying to Sit;Left Sidelying to Sit;Sitting - Scoot to Edge of Bed;Sit to Supine Rolling Right: Not tested (comment);Other (comment) (pt. unable to tolerate) Rolling Left: 4: Min assist;With rail Right Sidelying to Sit: Not tested (comment) Left Sidelying to Sit: Not tested (comment) Sitting - Scoot to Edge of Bed: Not tested (comment) Sit to Supine:  Not Tested (comment) Details for Bed Mobility Assistance: pt. with increased pain in the attempt to roll to right from supine, limiting her attempt.  She was able to tolerate left side to supine and back to left side with min assist and increased time. Transfers Transfers: Not assessed (pt. unable today) Ambulation/Gait Ambulation/Gait Assistance: Not tested (comment) (pt,. unable today)    Exercises     PT Diagnosis: Difficulty walking;Acute pain  PT Problem List: Decreased activity tolerance;Decreased mobility;Decreased knowledge of use of DME;Decreased knowledge of precautions;Pain PT Treatment Interventions: DME instruction;Gait training;Functional mobility training;Therapeutic activities;Patient/family education   PT Goals Acute Rehab PT Goals PT Goal Formulation: With patient Time For Goal Achievement: 11/03/12 Potential to Achieve Goals: Good Pt will Roll Supine to Right Side: with modified independence PT Goal: Rolling Supine to Right Side - Progress: Goal set today Pt will Roll Supine to Left Side: with modified independence PT Goal: Rolling Supine to Left Side - Progress: Goal set today Pt will go Supine/Side to Sit: with supervision PT Goal: Supine/Side to Sit - Progress: Goal set today Pt will Sit at Edge of Bed: with modified independence;1-2 min PT Goal: Sit at Edge Of Bed - Progress: Goal set today Pt will go Sit to Supine/Side: with supervision PT Goal: Sit to Supine/Side - Progress: Goal set today Pt will go Sit to Stand: with supervision;with upper extremity assist PT Goal: Sit to Stand - Progress: Goal set today Pt will go Stand to Sit: with supervision PT Goal: Stand to Sit - Progress: Goal set today Pt will Ambulate: 51 - 150 feet;with supervision;with least restrictive assistive device PT Goal: Ambulate - Progress: Goal set today  Additional Goals Additional Goal #1: Pt. will state and adhere to 3/3 back precautions and log rolling technique PT Goal:  Additional Goal #1 - Progress: Goal set today  Visit Information  Last PT Received On: 10/27/12 Assistance Needed: +2    Subjective Data  Subjective: Pt. presents sidelying in bed, reports pain in low back Patient Stated Goal: more active without pain   Prior Functioning  Home Living Lives With: Spouse Available Help at Discharge: Family;Available 24 hours/day Type of Home: Apartment Home Access: Level entry Home Layout: One level Bathroom Shower/Tub: Tub/shower unit;C[employer]ighter: Standard Bathroom Accessibility: Yes How Accessible: Accessible via walker Home Adaptive Equipment: Walker - rolling;Grab bars in shower Prior Function Level of Independence: Independent (needed extra time to complete all tasks) Able to Take Stairs?: Yes Driving: Yes Vocation: Full time employment Communication Communication: No difficulties Dominant Hand: Right    Cognition  Cognition Overall Cognitive Status: Appears within functional limits for tasks assessed/performed Arousal/Alertness: Awake/alert Orientation Level: Oriented X4 / Intact Behavior During Session: [facility] for tasks performed    Extremity/Trunk Assessment Right Upper Extremity Assessment RUE ROM/Strength/Tone: [facility] for tasks assessed Left Upper Extremity Assessment LUE ROM/Strength/Tone: WFL for tasks assessed Right Lower Extremity Assessment RLE ROM/Strength/Tone: WFL for tasks assessed RLE Sensation: WFL - Light Touch Left Lower Extremity Assessment LLE ROM/Strength/Tone: WFL for tasks assessed LLE Sensation: WFL - Light Touch   Balance    End of Session PT - End of Session Activity Tolerance: Patient limited by pain Patient left: in bed;with call bell/phone within reach Nurse Communication: Mobility status  GP     Ferman Hamming 10/27/2012, 3:20 PM Weldon Picking PT Acute Rehab Services [phone] Beeper [phone]

## 2012-10-27 NOTE — Progress Notes (Signed)
OT Cancellation Note  Patient Details Name: Brenda Haas MRN: [id number] DOB: Jan 14, 1951   Cancelled Treatment:    Reason Eval/Treat Not Completed:  Awaiting back brace before mobilizing pt. Will continue to follow.  Evern Bio 10/27/2012, 12:49 PM

## 2012-10-27 NOTE — Evaluation (Signed)
Physical Therapy Evaluation Patient Details Name: Brenda Haas MRN: [id number] DOB: [birth year] Today's Date: 10/27/2012 Time: 1410-1443 PT Time Calculation (min): 33 min  PT Assessment / Plan / Recommendation Clinical Impression  Pt. is s/p 4  level PLIF with significant pain limiting her participation in PT today to just rolling left side<>supine.  SHe was unable to tolerate rolling to right side due to pain.  Her lumbar corset has been ordered but has not yet arrived.  Disucssed with nursing the best technique to attempt to transfer her to 3n1 once foley removed.  Expect this pt. will progress slowly due to her current pain level  and multilevel PLIF.    PT Assessment  Patient needs continued PT services    Follow Up Recommendations  Home health PT;Supervision/Assistance - 24 hour;Supervision for mobility/OOB    Does the patient have the potential to tolerate intense rehabilitation      Barriers to Discharge None husband (currently separated) is available to assist as long as she progresses adequately    Equipment Recommendations  Rolling walker with 5" wheels    Recommendations for Other Services     Frequency Min 5X/week    Precautions / Restrictions Precautions Precautions: Back Precaution Booklet Issued: Yes (comment) Precaution Comments: instructed in log rolling and back precauations Required Braces or Orthoses: Other Brace/Splint (awaiting arrival of lumbar corset) Restrictions Weight Bearing Restrictions: No   Pertinent Vitals/Pain Pain in low back 10/10, pt. Using PCA and RN well aware of pt's current pain status      Mobility  Bed Mobility Bed Mobility: Rolling Right;Rolling Left;Right Sidelying to Sit;Left Sidelying to Sit;Sitting - Scoot to Edge of Bed;Sit to Supine Rolling Right: Not tested (comment);Other (comment) (pt. unable to tolerate) Rolling Left: 4: Min assist;With rail Right Sidelying to Sit: Not tested (comment) Left Sidelying to Sit: Not  tested (comment) Sitting - Scoot to Edge of Bed: Not tested (comment) Sit to Supine: Not Tested (comment) Details for Bed Mobility Assistance: pt. with increased pain in the attempt to roll to right from supine, limiting her attempt.  She was able to tolerate left side to supine and back to left side with min assist and increased time. Transfers Transfers: Not assessed (pt. unable today) Ambulation/Gait Ambulation/Gait Assistance: Not tested (comment) (pt,. unable today)    Exercises     PT Diagnosis: Difficulty walking;Acute pain  PT Problem List: Decreased activity tolerance;Decreased mobility;Decreased knowledge of use of DME;Decreased knowledge of precautions;Pain PT Treatment Interventions: DME instruction;Gait training;Functional mobility training;Therapeutic activities;Patient/family education   PT Goals Acute Rehab PT Goals PT Goal Formulation: With patient Time For Goal Achievement: 11/03/12 Potential to Achieve Goals: Good Pt will Roll Supine to Right Side: with modified independence PT Goal: Rolling Supine to Right Side - Progress: Goal set today Pt will Roll Supine to Left Side: with modified independence PT Goal: Rolling Supine to Left Side - Progress: Goal set today Pt will go Supine/Side to Sit: with supervision PT Goal: Supine/Side to Sit - Progress: Goal set today Pt will Sit at Edge of Bed: with modified independence;1-2 min PT Goal: Sit at Edge Of Bed - Progress: Goal set today Pt will go Sit to Supine/Side: with supervision PT Goal: Sit to Supine/Side - Progress: Goal set today Pt will go Sit to Stand: with supervision;with upper extremity assist PT Goal: Sit to Stand - Progress: Goal set today Pt will go Stand to Sit: with supervision PT Goal: Stand to Sit - Progress: Goal set today Pt will Ambulate:  51 - 150 feet;with supervision;with least restrictive assistive device PT Goal: Ambulate - Progress: Goal set today Additional Goals Additional Goal #1: Pt. will  state and adhere to 3/3 back precautions and log rolling technique PT Goal: Additional Goal #1 - Progress: Goal set today  Visit Information  Last PT Received On: 10/27/12 Assistance Needed: +2    Subjective Data  Subjective: Pt. presents sidelying in bed, reports pain in low back Patient Stated Goal: more active without pain   Prior Functioning  Home Living Lives With: Spouse Available Help at Discharge: Family;Available 24 hours/day Type of Home: Apartment Home Access: Level entry Home Layout: One level Bathroom Shower/Tub: Tub/shower unit;[employer]: Standard Bathroom Accessibility: Yes How Accessible: Accessible via walker Home Adaptive Equipment: Walker - rolling;Grab bars in shower Prior Function Level of Independence: Independent (needed extra time to complete all tasks) Able to Take Stairs?: Yes Driving: Yes Vocation: Full time employment Communication Communication: No difficulties Dominant Hand: Right    Cognition  Cognition Overall Cognitive Status: Appears within functional limits for tasks assessed/performed Arousal/Alertness: Awake/alert Orientation Level: Oriented X4 / Intact Behavior During Session: [facility] for tasks performed    Extremity/Trunk Assessment Right Upper Extremity Assessment RUE ROM/Strength/Tone: [facility] for tasks assessed Left Upper Extremity Assessment LUE ROM/Strength/Tone: WFL for tasks assessed Right Lower Extremity Assessment RLE ROM/Strength/Tone: WFL for tasks assessed RLE Sensation: WFL - Light Touch Left Lower Extremity Assessment LLE ROM/Strength/Tone: WFL for tasks assessed LLE Sensation: WFL - Light Touch   Balance    End of Session PT - End of Session Activity Tolerance: Patient limited by pain Patient left: in bed;with call bell/phone within reach Nurse Communication: Mobility status  GP     Ferman Hamming 10/27/2012, 3:01 PM Weldon Picking PT Acute Rehab Services [phone] Beeper [phone]

## 2012-10-27 NOTE — Op Note (Signed)
Brenda Haas, Brenda Haas             ACCOUNT NO.:  [id number]  MEDICAL RECORD NO.:  [id number]  LOCATION:  [id number]                        FACILITY:  [facility]  PHYSICIAN:  Hilda Lias, M.D.   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  Failure with conservative treatment.  POSTOPERATIVE DIAGNOSES:  Lumbar degenerative disk disease with scoliosis, facet arthropathy, chronic radiculopathy, stenosis at L2-3, L3-4, L5-5, L5-S1. Failure with conservative treatment.  PROCEDURES:  Bilaterally L2, L3, L4, L5 laminectomy; bilateral facetectomy; bilaterally L2-L3, L3-L4, L4-L5 diskectomy more than normal to be allowed to introduce cages; decompression of the L2, L3, L4, L5, S1 nerve root;  cages 1 at the level L2-L3, 1 at the level of L3-L4, 2 at the level of L4-L5; pedicle screws in L2, L3, L4, L5, S1 bilaterally; posterolateral arthrodesis with autograft of bone extension from L2-L3 down to L5-S1, CellSaver, C-arm.  SURGEON:  Hilda Lias, M.D.  ASSISTANT:  Dr. Lovell Sheehan.  CLINICAL HISTORY:  Brenda Haas is a 62 year old female who had been complaining of back pain worse to both legs.  The patient was seen by a neurosurgeon in [place].  At that time, she was advised that she might require only a foraminotomy.  Nevertheless, she came on 3 occasions, the last one with her husband with increase of back pain associated with weakness of both legs.  Doing a foraminotomy would be the procedure that might help her for a few months, but her main problem is the severe stenosis with degenerative disk disease and facet arthropathy.  Surgery was advised.  The surgery was fully explained to her and her husband including the risk and benefit.  DESCRIPTION OF PROCEDURE:  The  patient was taken to the OR, and after intubation, she was positioned in a prone manner.  The back was cleaned with DuraPrep and Betadine.  Drapes were applied.  Midline incision from L2 down to L5-S1 was made.  Muscles were retracted laterally all the way to be able to feel and see the transverse process.  Then, with the Leksell, we removed the spinal process of 2, 3, 4, 5.  Laminectomy was achieved.  Then, we proceeded with facetectomy of L2 down to L5.  We entered the disk space at the level of 2-3.  The patient has quite a bit of narrowing in that area.  Decompression of the thecal sac was done as well as foraminotomy.  The area in the right 2-3 was quite tight with quite a bit of foraminal narrowing.  Total diskectomy was achieved.  At this level, we were able to introduce 1 cages 10 x 22 with autograft bone extension.  At the level of 3-4, we found the same findings with severe stenosis.  Decompression was achieved.  Bilateral diskectomy was done, and at this level, a cage of 12 x 22 was inserted.  At the level of 4-5, after we did laminectomy, we were able to introduce 2 cages of 12 x 22.  The 5-1 was quite narrow and there  was no space to do any type of diskectomy.  Foraminotomy was achieved with plenty of the compression only of the L2 but all the way down to the L5-S1 nerve root.  There was more at the area at the level of 2-3.  There was a small arachnoid pouch that was taken care with the plain 6-0 Prolene.  From then on, we used the C-arm in AP and then lateral view, we probed the pedicle of 2, 3, 4, 5, and S1.  We introduced of 10 screws, 8 of them of 5.5 x 45 and 2 of 5.5 x 50 at the level of S1.  Good position was achieved with the help of the C-arm.  Then, the screws were connected with the rod keeping in place with Capps.  Cross-link from right to left was done.  Then, we went laterally and we removed the periosteum of the lateral aspect of the facet of 2-3, 3-4, 4-5, 5-1  and a mix of autograft of bone extension was used for arthrodesis.  Valsalva maneuver was done 2 times up to the 40 and essentially was negative.  The area was irrigated.  A drain was left in the epidural space and the wound was closed with Vicryl and Steri-Strips.          ______________________________ Hilda Lias, M.D.     EB/MEDQ  D:  10/26/2012  T:  10/27/2012  Job:  [id number]

## 2012-10-28 ENCOUNTER — Ambulatory Visit (HOSPITAL_COMMUNITY)
Admission: RE | Admit: 2012-10-28 | Discharge: 2012-10-28 | Disposition: A | Discharge status: Home / Self Care | Payer: BC Managed Care – PPO | Source: Ambulatory Visit | Attending: General Surgery | Admitting: General Surgery

## 2012-10-28 ENCOUNTER — Encounter (HOSPITAL_COMMUNITY): Payer: Self-pay | Admitting: Radiology

## 2012-10-28 DIAGNOSIS — E042 Nontoxic multinodular goiter: Secondary | ICD-10-CM

## 2012-10-28 MED ORDER — TAMSULOSIN HCL 0.4 MG PO CAPS
0.8000 mg | ORAL_CAPSULE | Freq: Every day | ORAL | Status: DC
  Administered 2012-10-28 – 2012-11-10 (×14): 0.8 mg via ORAL
  Filled 2012-10-28 (×15): qty 2

## 2012-10-28 MED ORDER — MIDAZOLAM HCL 2 MG/2ML IJ SOLN
INTRAMUSCULAR | Status: AC
  Filled 2012-10-28: qty 4

## 2012-10-28 MED ORDER — HEPARIN SODIUM (PORCINE) 5000 UNIT/ML IJ SOLN
5000.0000 [IU] | Freq: Three times a day (TID) | INTRAMUSCULAR | Status: DC
  Administered 2012-10-28 – 2012-11-09 (×26): 5000 [IU] via SUBCUTANEOUS
  Filled 2012-10-28 (×42): qty 1

## 2012-10-28 MED ORDER — MIDAZOLAM HCL 2 MG/2ML IJ SOLN
INTRAMUSCULAR | Status: AC | PRN
  Administered 2012-10-28: 2 mg via INTRAVENOUS

## 2012-10-28 MED ORDER — FENTANYL CITRATE 0.05 MG/ML IJ SOLN
INTRAMUSCULAR | Status: AC
  Filled 2012-10-28: qty 4

## 2012-10-28 MED ORDER — FENTANYL CITRATE 0.05 MG/ML IJ SOLN
INTRAMUSCULAR | Status: AC | PRN
  Administered 2012-10-28: 50 ug via INTRAVENOUS

## 2012-10-28 MED ORDER — GABAPENTIN 400 MG PO CAPS
400.0000 mg | ORAL_CAPSULE | Freq: Three times a day (TID) | ORAL | Status: DC
  Administered 2012-10-28 – 2012-11-01 (×12): 400 mg via ORAL
  Filled 2012-10-28 (×13): qty 1

## 2012-10-28 NOTE — H&P (Signed)
Brenda Haas is an 62 y.o. female.   Chief Complaint: pt was scheduled for B thyroid nodule biopsy as an outpatient Per Dr Abbey Chatters today But now in patient post Lumbar fusion surgery 10/26/12 Scheduled now as In pt for thyroid bx with sedation per Dr Jeral Fruit HPI: HLD; SVT; IBS; lung nodules; skin ca  Past Medical History  Diagnosis Date  . Hyperlipidemia     borderline  . Supraventricular tachycardia, paroxysmal   . Irritable bowel syndrome (IBS)   . Spastic colon   . Insomnia   . Hypercalcemia   . Multiple lung nodules     CT 2009  . Cancer     h/o squamous cell carcinoma  . Thyroid disease     goiter and right thyroid nodule  . Chronic headaches     controlled w/ fioricet  . Allergy   . Chronic low back pain   . Chronic pain disorder   . Complication of anesthesia     difficulty going to sleep  . PONV (postoperative nausea and vomiting)   . Family history of anesthesia complication     Son- difficulty waking up  . Constipation   . GERD (gastroesophageal reflux disease)     did take Nexium , not needed lately  . Hemorrhoids     Past Surgical History  Procedure Laterality Date  . Knee arthroscopy    . Dilation and curettage of uterus    . Colonscopy    . Tonsillectomy    . Posterior lumbar fusion 4 level N/A 10/26/2012    Procedure: POSTERIOR LUMBAR FUSION 4 LEVEL;  Surgeon: Karn Cassis, MD;  Location: [facility];  Service: Neurosurgery;  Laterality: N/A;  Lumbar two-three, lumbar three-four,lumbar four-five,Lumbar five-Sacral oneDiskectomy/Fusion/Cages/Pedicle screws/Posterolateral arthrodesis/Cellsaver C-Arm    Family History  Problem Relation Age of Onset  . Cancer Mother     Bladder  . Heart attack Father   . Cancer Maternal Aunt     Lung  . Cancer Maternal Grandfather     Prostate   Social History:  reports that she quit smoking about 8 years ago. She has never used smokeless tobacco. She reports that  drinks alcohol. She reports that she  does not use illicit drugs.  Allergies:  Allergies  Allergen Reactions  . Penicillins Swelling    Medications Prior to Admission  Medication Sig Dispense Refill  . BIOTIN PO Take 10,000 mcg by mouth daily.       . Calcium Carbonate (CALTRATE 600 PO) Take 600 mg by mouth daily. 1500 mg 1 qd w/ food      . clobetasol cream (TEMOVATE) 0.05 % Apply 1 application topically daily as needed.       . diazepam (VALIUM) 5 MG tablet Take 5 mg by mouth at bedtime.       . gabapentin (NEURONTIN) 800 MG tablet Take 800 mg by mouth at bedtime.       Marland Kitchen glucosamine-chondroitin 500-400 MG tablet Take 1 tablet by mouth daily.      Marland Kitchen HYDROcodone-acetaminophen (LORTAB) 7.5-500 MG per tablet Take 1 tablet by mouth 2 (two) times daily as needed for pain.       Marland Kitchen ibuprofen (ADVIL,MOTRIN) 800 MG tablet Take 800 mg by mouth 2 (two) times daily as needed for pain.       Marland Kitchen lidocaine (LIDODERM) 5 % Place 1 patch onto the skin daily as needed (for pain). Remove & Discard patch within 12 hours or as directed by MD      .  Multiple Vitamin (MULITIVITAMIN WITH MINERALS) TABS Take 1 tablet by mouth daily.        . Omega-3 Fatty Acids (OMEGA 3 PO) Take 1 capsule by mouth daily.      . Safflower Oil (CLA) 1000 MG CAPS Take 1,000 mg by mouth daily.      Marland Kitchen senna (SENOKOT) 8.6 MG TABS Take 2 tablets by mouth 3 (three) times daily.      . traZODone (DESYREL) 100 MG tablet Take 100 mg by mouth at bedtime.        Results for orders placed during the hospital encounter of 10/26/12 (from the past 48 hour(s))  SURGICAL PCR SCREEN     Status: None   Collection Time    10/26/12  9:14 AM      Result Value Range   MRSA, PCR NEGATIVE  NEGATIVE   Staphylococcus aureus NEGATIVE  NEGATIVE   Comment:            The Xpert SA Assay (FDA     approved for NASAL specimens     in patients over 49 years of age),     is one component of     a comprehensive surveillance     program.  Test performance has     been validated by [employer] for patients greater     than or equal to 32 year old.     It is not intended     to diagnose infection nor to     guide or monitor treatment.  TYPE AND SCREEN     Status: None   Collection Time    10/26/12  9:20 AM      Result Value Range   ABO/RH(D) B POS     Antibody Screen NEG     Sample Expiration 10/29/2012     Unit Number [id number]     Blood Component Type RED CELLS,LR     Unit division 00     Status of Unit ALLOCATED     Transfusion Status OK TO TRANSFUSE     Crossmatch Result Compatible     Unit Number [id number]     Blood Component Type RED CELLS,LR     Unit division 00     Status of Unit ALLOCATED     Transfusion Status OK TO TRANSFUSE     Crossmatch Result Compatible    ABO/RH     Status: None   Collection Time    10/26/12  9:20 AM      Result Value Range   ABO/RH(D) B POS    POCT I-STAT 4, (NA,K, GLUC, HGB,HCT)     Status: Abnormal   Collection Time    10/26/12  2:26 PM      Result Value Range   Sodium 141  135 - 145 mEq/L   Potassium 3.7  3.5 - 5.1 mEq/L   Glucose, Bld 115 (*) 70 - 99 mg/dL   HCT 32.4 (*) 40.1 - 02.7 %   Hemoglobin 9.2 (*) 12.0 - 15.0 g/dL  POCT I-STAT 4, (NA,K, GLUC, HGB,HCT)     Status: Abnormal   Collection Time    10/26/12  3:25 PM      Result Value Range   Sodium 141  135 - 145 mEq/L   Potassium 3.8  3.5 - 5.1 mEq/L   Glucose, Bld 136 (*) 70 - 99 mg/dL   HCT 25.3 (*) 66.4 - 40.3 %   Hemoglobin 9.5 (*) 12.0 -  15.0 g/dL   Dg Lumbar Spine 2-3 Views  10/26/2012  *RADIOLOGY REPORT*  Clinical Data: LUMBAR FUSION L2-S1.  LUMBAR SPINE - 2-3 VIEW,DG C-ARM 1-60 MIN  Comparison: CT myelogram  09/02/2012.  Findings: Intraoperative spot images demonstrate placement of posterior pedicle screws from L2-S1.  No hardware or bony complicating feature visualized on these intraoperative spot images.  IMPRESSION: Posterior pedicle screw placement L2-S1.   Original Report Authenticated By: Charlett Nose, M.D.    Dg Lumbar Spine 1  View  10/26/2012  *RADIOLOGY REPORT*  Clinical Data: Posterior lumbar fusion  LUMBAR SPINE - 1 VIEW  Comparison: CT myelogram 09/02/2012  Findings: Posterior surgical instruments are in place and extend from the inferior aspect of L3 to the inferior aspect of L5.  IMPRESSION: Intraoperative localization as above.   Original Report Authenticated By: Charlett Nose, M.D.    Dg C-arm 1-60 Min  10/26/2012  *RADIOLOGY REPORT*  Clinical Data: LUMBAR FUSION L2-S1.  LUMBAR SPINE - 2-3 VIEW,DG C-ARM 1-60 MIN  Comparison: CT myelogram  09/02/2012.  Findings: Intraoperative spot images demonstrate placement of posterior pedicle screws from L2-S1.  No hardware or bony complicating feature visualized on these intraoperative spot images.  IMPRESSION: Posterior pedicle screw placement L2-S1.   Original Report Authenticated By: Charlett Nose, M.D.     Review of Systems  Constitutional: Negative for fever.  Respiratory: Negative for shortness of breath.   Musculoskeletal: Positive for back pain.  Neurological: Positive for weakness and headaches. Negative for dizziness.    Blood pressure 98/52, pulse 68, temperature 97.2 F (36.2 C), temperature source Oral, resp. rate 14, height 5\' 8"  (1.727 m), weight 189 lb 3.2 oz (85.821 kg), SpO2 95.00%. Physical Exam  Constitutional: She is oriented to person, place, and time.  Cardiovascular: Normal rate, regular rhythm and normal heart sounds.   No murmur heard. Respiratory: Effort normal and breath sounds normal. She has no wheezes.  GI: Soft. Bowel sounds are normal. There is no tenderness.  Musculoskeletal: Normal range of motion.  Back painful  Neurological: She is alert and oriented to person, place, and time.  Skin: Skin is warm and dry.  Psychiatric: She has a normal mood and affect. Her behavior is normal. Judgment and thought content normal.     Assessment/Plan B thyroid nodules Scheduled as OP for today for bx Pt now IP for back surgery and requests bx to  be done while in hospital Okayed per Dr Jeral Fruit Scheduled now for B thyroid nodule bx with sedation Pt aware of procedure benefits and risks and agreeable to proceed Consent signed and in chart   Jaziah Kwasnik A 10/28/2012, 9:13 AM

## 2012-10-28 NOTE — Progress Notes (Signed)
Patient was able to stand up on the side of the bed and transfer to bedside commode. Pt with severe left leg weakness and pain, sts it "Feels like a torn hamstring". Pt very weak transferring back to bed. Pt able to bend leg, but unable to raise it straight off bed.  Also has difficulty turning to right side because of pain in back of leg.

## 2012-10-28 NOTE — Progress Notes (Signed)
Had the thyroid biopsy without complications. Foley in place. Some pain left proximal leg , hemovac in place

## 2012-10-28 NOTE — Progress Notes (Signed)
Inpt rehab consult ordered. [facility] will not approve an inpt rehab admission for this diagnosis. Please call me with any questions. [phone]

## 2012-10-28 NOTE — Progress Notes (Signed)
Patient ID: Brenda Haas, female   DOB: November 18, 1950, 62 y.o.   MRN: [id number] Satble, c/o incisional pain  Also some pain and weakness at the level of left proximal quad, distally normal, right side nl. Pt/ot to see. On flomax

## 2012-10-28 NOTE — Progress Notes (Signed)
Pt was unable to void trying to on both BSC and bedpan. Pt felt urge to void, bladder scan revealed 470. I/O cath performed and 450 ml clear yellow urine returned. Fluids encouraged, will reassess before shift change.

## 2012-10-28 NOTE — Progress Notes (Signed)
Patient ID: Brenda Haas, female   DOB: 1951-03-10, 62 y.o.   MRN: [id number] Sleepy post thyroid bx. Spoke with husband

## 2012-10-28 NOTE — Progress Notes (Signed)
Physical Therapy Treatment Patient Details Name: Brenda Haas MRN: [id number] DOB: [birth year] Today's Date: 10/28/2012 Time: 4098-1191 PT Time Calculation (min): 26 min  PT Assessment / Plan / Recommendation Comments on Treatment Session  Pt. with gradual improvement in her activity tolerance today.  Has marked weakness in L LE, especially  of quads.  Due to weakness in left LE, she is at risk for knee buckling/falls.  Recommend 2 assist for mobility currently.  discussed with RN Evea.    Follow Up Recommendations  Home health PT;Supervision/Assistance - 24 hour;Supervision for mobility/OOB     Does the patient have the potential to tolerate intense rehabilitation     Barriers to Discharge        Equipment Recommendations  Rolling walker with 5" wheels    Recommendations for Other Services    Frequency Min 5X/week   Plan Discharge plan remains appropriate;Frequency remains appropriate;Other (comment) (as long as pt. able to progress adequately)    Precautions / Restrictions Precautions Precautions: Back Precaution Booklet Issued: Yes (comment) Precaution Comments: reinforced  log rolling and back precauations Required Braces or Orthoses: Spinal Brace Spinal Brace: Lumbar corset;Applied in sitting position Restrictions Weight Bearing Restrictions: No   Pertinent Vitals/Pain Pain in low back and left lateral thigh.  RN aware and pt. Positioned for comfort after mobility.     Mobility  Bed Mobility Bed Mobility: Rolling Left;Left Sidelying to Sit;Sitting - Scoot to [place] of Bed Rolling Left: 4: Min assist;With rail Left Sidelying to Sit: 1: +2 Total assist;HOB flat Left Sidelying to Sit: Patient Percentage: 40% Sitting - Scoot to Edge of Bed: 4: Min assist Sit to Supine: Not Tested (comment) Details for Bed Mobility Assistance: pt. able to tolerate full roll to left and side to sit, requiring 2 assist for moving from side to sit. Transfers Transfers: Sit to Stand;Stand  to Sit Sit to Stand: 1: +2 Total assist;With upper extremity assist;From bed Sit to Stand: Patient Percentage: 50% Stand to Sit: 1: +2 Total assist;With armrests;To chair/3-in-1 Stand to Sit: Patient Percentage: 50% Details for Transfer Assistance: Pt. has marked weakness in left LE , making transitions difficult.  Needs support of left LE to position correctly and for blocking knee in standing. Ambulation/Gait Ambulation/Gait Assistance: 1: +2 Total assist Ambulation/Gait: Patient Percentage: 60% Ambulation Distance (Feet): 2 Feet Assistive device: Rolling walker Ambulation/Gait Assistance Details: Pt. able to take 2 turning steps to recliner with blocking of L LE for safety and to prevent buckling.  Practiced shallow knee bends in standing to facilitate left quads, hamstrings and gluts. Gait Pattern: Step-through pattern;Decreased step length - right;Decreased step length - left;Decreased stance time - left;Decreased hip/knee flexion - left;Decreased dorsiflexion - left Stairs: No    Exercises General Exercises - Lower Extremity Ankle Circles/Pumps: AROM;Left;5 reps;Seated Long Arc Quad: AROM;Left;5 reps;Seated;Other (comment) (initiating quads, pt. unable to fully extend knee)   PT Diagnosis:    PT Problem List:   PT Treatment Interventions:     PT Goals Acute Rehab PT Goals PT Goal Formulation: With patient Time For Goal Achievement: 11/03/12 Potential to Achieve Goals: Good Pt will Roll Supine to Left Side: with modified independence PT Goal: Rolling Supine to Left Side - Progress: Progressing toward goal Pt will Sit at Edge of Bed: with modified independence;1-2 min PT Goal: Sit at Edge Of Bed - Progress: Progressing toward goal Pt will go Sit to Stand: with supervision;with upper extremity assist PT Goal: Sit to Stand - Progress: Progressing toward goal Pt will go  Stand to Sit: with supervision PT Goal: Stand to Sit - Progress: Progressing toward goal Pt will Ambulate: 51 -  150 feet;with supervision;with least restrictive assistive device PT Goal: Ambulate - Progress: Progressing toward goal Additional Goals Additional Goal #1: Pt. will state and adhere to 3/3 back precautions and log rolling technique PT Goal: Additional Goal #1 - Progress: Progressing toward goal  Visit Information  Last PT Received On: 10/28/12 Assistance Needed: +2 PT/OT Co-Evaluation/Treatment: Yes    Subjective Data  Subjective: "I haven't been able to pee yet on my own" Patient Stated Goal: more active without pain   Cognition  Cognition Overall Cognitive Status: Appears within functional limits for tasks assessed/performed Arousal/Alertness: Awake/alert Orientation Level: Oriented X4 / Intact Behavior During Session: [facility] for tasks performed Cognition - Other Comments: actually smiling a little today    Balance     End of Session PT - End of Session Equipment Utilized During Treatment: Gait belt;Back brace Activity Tolerance: Patient limited by pain;Other (comment) (improved tolerance today despite pain) Patient left: in chair;with call bell/phone within reach;with nursing in room Nurse Communication: Mobility status;Other (comment) (ned for 2 assist for mobility)   GP     Ferman Hamming 10/28/2012, 9:41 AM Weldon Picking PT Acute Rehab Services [phone] [place] [phone]

## 2012-10-28 NOTE — Progress Notes (Signed)
Patient ID: Brenda Haas, female   DOB: May 19, 1951, 62 y.o.   MRN: [id number] Sleepy post thyroid biopsy. Spoke with husband

## 2012-10-28 NOTE — Progress Notes (Signed)
Pt down to ultrasound for thyroid bx.,  Via bed.  IV fluids and PCA morphine on hold while off unit.

## 2012-10-28 NOTE — Progress Notes (Signed)
Physical Therapy Treatment Patient Details Name: Brenda Haas MRN: [id number] DOB: 1951-02-21 Today's Date: 10/28/2012 Time: 4098-1191 PT Time Calculation (min): 26 min  PT Assessment / Plan / Recommendation Comments on Treatment Session  Pt. with gradual improvement in her activity tolerance today.  Has marked weakness in L LE, especially  of quads.  Due to weakness in left LE, she is at risk for knee buckling/falls.  Recommend 2 assist for mobility currently.  discussed with RN Evea.    Follow Up Recommendations  Home health PT;Supervision/Assistance - 24 hour;Supervision for mobility/OOB     Does the patient have the potential to tolerate intense rehabilitation     Barriers to Discharge        Equipment Recommendations  Rolling walker with 5" wheels    Recommendations for Other Services    Frequency Min 5X/week   Plan Discharge plan remains appropriate;Frequency remains appropriate;Other (comment) (as long as pt. able to progress adequately)    Precautions / Restrictions Precautions Precautions: Back Precaution Booklet Issued: Yes (comment) Precaution Comments: reinforced  log rolling and back precauations Required Braces or Orthoses: Spinal Brace Spinal Brace: Lumbar corset;Applied in sitting position Restrictions Weight Bearing Restrictions: No   Pertinent Vitals/Pain     Mobility  Bed Mobility Bed Mobility: Rolling Left;Left Sidelying to Sit;Sitting - Scoot to [place] of Bed Rolling Left: 4: Min assist;With rail Left Sidelying to Sit: 1: +2 Total assist;HOB flat Left Sidelying to Sit: Patient Percentage: 40% Sitting - Scoot to Edge of Bed: 4: Min assist Sit to Supine: Not Tested (comment) Details for Bed Mobility Assistance: pt. able to tolerate full roll to left and side to sit, requiring 2 assist for moving from side to sit. Transfers Transfers: Sit to Stand;Stand to Sit Sit to Stand: 1: +2 Total assist;With upper extremity assist;From bed Sit to Stand:  Patient Percentage: 50% Stand to Sit: 1: +2 Total assist;With armrests;To chair/3-in-1 Stand to Sit: Patient Percentage: 50% Details for Transfer Assistance: Pt. has marked weakness in left LE , making transitions difficult.  Needs support of left LE to position correctly and for blocking knee in standing. Ambulation/Gait Ambulation/Gait Assistance: 1: +2 Total assist Ambulation/Gait: Patient Percentage: 60% Ambulation Distance (Feet): 2 Feet Assistive device: Rolling walker Ambulation/Gait Assistance Details: Pt. able to take 2 turning steps to recliner with blocking of L LE for safety and to prevent buckling.  Practiced shallow knee bends in standing to facilitate left quads, hamstrings and gluts. Gait Pattern: Step-through pattern;Decreased step length - right;Decreased step length - left;Decreased stance time - left;Decreased hip/knee flexion - left;Decreased dorsiflexion - left Stairs: No    Exercises General Exercises - Lower Extremity Ankle Circles/Pumps: AROM;Left;5 reps;Seated Long Arc Quad: AROM;Left;5 reps;Seated;Other (comment) (initiating quads, pt. unable to fully extend knee)   PT Diagnosis:    PT Problem List:   PT Treatment Interventions:     PT Goals Acute Rehab PT Goals PT Goal Formulation: With patient Time For Goal Achievement: 11/03/12 Potential to Achieve Goals: Good Pt will Roll Supine to Left Side: with modified independence PT Goal: Rolling Supine to Left Side - Progress: Progressing toward goal Pt will Sit at Edge of Bed: with modified independence;1-2 min PT Goal: Sit at Edge Of Bed - Progress: Progressing toward goal Pt will go Sit to Stand: with supervision;with upper extremity assist PT Goal: Sit to Stand - Progress: Progressing toward goal Pt will go Stand to Sit: with supervision PT Goal: Stand to Sit - Progress: Progressing toward goal Pt will Ambulate:  51 - 150 feet;with supervision;with least restrictive assistive device PT Goal: Ambulate -  Progress: Progressing toward goal Additional Goals Additional Goal #1: Pt. will state and adhere to 3/3 back precautions and log rolling technique PT Goal: Additional Goal #1 - Progress: Progressing toward goal  Visit Information  Last PT Received On: 10/28/12 Assistance Needed: +2 PT/OT Co-Evaluation/Treatment: Yes    Subjective Data  Subjective: "I haven't been able to pee yet on my own" Patient Stated Goal: more active without pain   Cognition  Cognition Overall Cognitive Status: Appears within functional limits for tasks assessed/performed Arousal/Alertness: Awake/alert Orientation Level: Oriented X4 / Intact Behavior During Session: [facility] for tasks performed Cognition - Other Comments: actually smiling a little today    Balance     End of Session PT - End of Session Equipment Utilized During Treatment: Gait belt;Back brace Activity Tolerance: Patient limited by pain;Other (comment) (improved tolerance today despite pain) Patient left: in chair;with call bell/phone within reach;with nursing in room Nurse Communication: Mobility status;Other (comment) (ned for 2 assist for mobility)   GP     Ferman Hamming 10/28/2012, 9:47 AM

## 2012-10-28 NOTE — Evaluation (Signed)
Occupational Therapy Evaluation Patient Details Name: Brenda Haas MRN: [id number] DOB: [birth year] Today's Date: 10/28/2012 Time: 4098-1191 OT Time Calculation (min): 33 min  OT Assessment / Plan / Recommendation Clinical Impression  Pt is 62y/o female s/p 4 level PLIF, whom currently requires +2 total assist for sit-stand and SPT related to functional mobility and ADL's. She plans to return home w/ assist from husband. Recommend acute OT followed by HHOT depending on pt progression toward goals    OT Assessment  Patient needs continued OT Services    Follow Up Recommendations  Home health OT    Barriers to Discharge      Equipment Recommendations  3 in 1 bedside comode    Recommendations for Other Services    Frequency  Min 3X/week    Precautions / Restrictions Restrictions Weight Bearing Restrictions: No   Pertinent Vitals/Pain Low back "soreness", pt rated 5/10 in sitting at EOB and used pain pump independently.    ADL  Grooming: Simulated;Wash/dry hands;Wash/dry face;Set up Where Assessed - Grooming: Supported sitting Upper Body Bathing: Simulated;Minimal assistance;Set up Where Assessed - Upper Body Bathing: Supported sitting Lower Body Bathing: Simulated;Maximal assistance Where Assessed - Lower Body Bathing: Rolling right and/or left Upper Body Dressing: Simulated;Minimal assistance Where Assessed - Upper Body Dressing: Supported sitting Lower Body Dressing: Performed;Maximal assistance (Donning socks) Where Assessed - Lower Body Dressing: Supported sitting Toilet Transfer: Simulated;+2 Total assistance (Pt LLE buckles, secondary to weakness) Toilet Transfer: Patient Percentage: 50% Toilet Transfer Method: Surveyor, minerals: Materials engineer and Hygiene: Simulated;+2 Total assistance Toileting - Architect and Hygiene: Patient Percentage: 30% Where Assessed - Engineer, mining and  Hygiene: Standing;Sit to stand from 3-in-1 or toilet Tub/Shower Transfer Method: Not assessed Equipment Used: Gait belt;Rolling walker;Other (comment) (Back brace) Transfers/Ambulation Related to ADLs: Pt requires +2 total assist for SPT transfers taking several steps while maintaining back precautions secondary to LLE weakness and buckling at times. ADL Comments: Pt is 62y/o female s/p 4 level PLIF, whom currently requires +2 total assist for sit-stand and SPT related to functional mobility and ADL's. She plans to return home w/ assist from husband. Recommend acute OT followed by HHOT depending on pt progression toward goals.    OT Diagnosis: Generalized weakness;Acute pain  OT Problem List: Decreased strength;Decreased activity tolerance;Decreased knowledge of precautions;Decreased knowledge of use of DME or AE;Pain OT Treatment Interventions: Self-care/ADL training;Energy conservation;DME and/or AE instruction;Therapeutic activities;Patient/family education   OT Goals Acute Rehab OT Goals Potential to Achieve Goals: Good  Visit Information  Last OT Received On: 10/28/12 Assistance Needed: +2    Subjective Data  Subjective: "I can't move that leg really" re: LLE Patient Stated Goal: Home as able   Prior Functioning     Home Living Lives With: Spouse Available Help at Discharge: Family;Available 24 hours/day Type of Home: Apartment Home Access: Level entry Home Layout: One level Bathroom Shower/Tub: Tub/shower unit;Curtain Firefighter: Standard Bathroom Accessibility: Yes How Accessible: Accessible via walker Home Adaptive Equipment: Walker - rolling;Grab bars in shower Prior Function Level of Independence: Independent (needed extra time to complete all tasks) Able to Take Stairs?: Yes Driving: Yes Vocation: Full time employment Communication Communication: No difficulties Dominant Hand: Right    Vision/Perception Vision - History Baseline Vision: Wears glasses for  distance only Patient Visual Report: No change from baseline   Cognition  Cognition Overall Cognitive Status: Appears within functional limits for tasks assessed/performed Arousal/Alertness: Awake/alert Orientation Level: Oriented X4 / Intact Behavior During  Session: [facility] for tasks performed    Extremity/Trunk Assessment Right Upper Extremity Assessment RUE ROM/Strength/Tone: Within functional levels RUE Sensation: WFL - Light Touch RUE Coordination: WFL - gross/fine motor Left Upper Extremity Assessment LUE ROM/Strength/Tone: Within functional levels LUE Sensation: WFL - Light Touch Left Lower Extremity Assessment LLE ROM/Strength/Tone: Deficits LLE ROM/Strength/Tone Deficits: Pt cont to present w/ weakness in bilateral LLE's L>R with transfers and functional mobility Trunk Assessment Trunk Assessment: Normal     Mobility Bed Mobility Bed Mobility: Rolling Left;Left Sidelying to Sit;Sitting - Scoot to [place] of Bed Rolling Left: 4: Min assist;With rail Left Sidelying to Sit: 1: +2 Total assist;HOB flat Left Sidelying to Sit: Patient Percentage: 40% Sitting - Scoot to Edge of Bed: 4: Min assist Sit to Supine: Not Tested (comment) Details for Bed Mobility Assistance: pt. able to tolerate full roll to left and side to sit, requiring 2 assist for moving from side to sit. Transfers Transfers: Sit to Stand;Stand to Sit Sit to Stand: 1: +2 Total assist;With upper extremity assist;From bed Sit to Stand: Patient Percentage: 50% Stand to Sit: 1: +2 Total assist;With armrests;To chair/3-in-1 Stand to Sit: Patient Percentage: 50% Details for Transfer Assistance: Pt. has marked weakness in left LE , making transitions difficult.  Needs support of left LE to position correctly and for blocking knee in standing.            End of Session OT - End of Session Equipment Utilized During Treatment: Gait belt;Back brace;Other (comment) (RW) Activity Tolerance: Patient tolerated treatment  well Patient left: in chair;with call bell/phone within reach;with family/visitor present  GO     Roselie Awkward Dixon 10/28/2012, 9:40 AM

## 2012-10-28 NOTE — Procedures (Signed)
Technically successful US guided biopsies of dominant right sided mid and mid/inf/post thyroid nodules. No immediate post procedural complications.

## 2012-10-28 NOTE — Evaluation (Signed)
Occupational Therapy Evaluation Patient Details Name: Brenda Haas MRN: [id number] DOB: [birth year] Today's Date: 10/28/2012 Time: 1478-2956 OT Time Calculation (min): 33 min  OT Assessment / Plan / Recommendation Clinical Impression  Pt is 62y/o female s/p 4 level PLIF, whom currently requires +2 total assist for sit-stand and SPT related to functional mobility and ADL's. She plans to return home w/ assist from husband. Recommend acute OT followed by HHOT depending on pt progression toward goals    OT Assessment  Patient needs continued OT Services    Follow Up Recommendations  Home health OT    Barriers to Discharge      Equipment Recommendations  3 in 1 bedside comode    Recommendations for Other Services    Frequency  Min 3X/week    Precautions / Restrictions Precautions Precautions: Back Precaution Booklet Issued: Yes (comment) Precaution Comments: reinforced  log rolling and back precauations Required Braces or Orthoses: Spinal Brace Spinal Brace: Lumbar corset;Applied in sitting position Restrictions Weight Bearing Restrictions: No   Pertinent Vitals/Pain     ADL  Grooming: Simulated;Wash/dry hands;Wash/dry face;Set up Where Assessed - Grooming: Supported sitting Upper Body Bathing: Simulated;Minimal assistance;Set up Where Assessed - Upper Body Bathing: Supported sitting Lower Body Bathing: Simulated;Maximal assistance Where Assessed - Lower Body Bathing: Rolling right and/or left Upper Body Dressing: Simulated;Minimal assistance Where Assessed - Upper Body Dressing: Supported sitting Lower Body Dressing: Performed;Maximal assistance (Donning socks) Where Assessed - Lower Body Dressing: Supported sitting Toilet Transfer: Simulated;+2 Total assistance (Pt LLE buckles, secondary to weakness) Toilet Transfer: Patient Percentage: 50% Toilet Transfer Method: Surveyor, minerals: Materials engineer and Hygiene:  Simulated;+2 Total assistance Toileting - Architect and Hygiene: Patient Percentage: 30% Where Assessed - Engineer, mining and Hygiene: Standing;Sit to stand from 3-in-1 or toilet Tub/Shower Transfer Method: Not assessed Equipment Used: Gait belt;Rolling walker;Other (comment) (Back brace) Transfers/Ambulation Related to ADLs: Pt requires +2 total assist for SPT transfers taking several steps while maintaining back precautions secondary to LLE weakness and buckling at times. ADL Comments: Pt is 62y/o female s/p 4 level PLIF, whom currently requires +2 total assist for sit-stand and SPT related to functional mobility and ADL's. She plans to return home w/ assist from husband. Recommend acute OT followed by HHOT depending on pt progression toward goals.    OT Diagnosis: Generalized weakness;Acute pain  OT Problem List: Decreased strength;Decreased activity tolerance;Decreased knowledge of precautions;Decreased knowledge of use of DME or AE;Pain OT Treatment Interventions: Self-care/ADL training;Energy conservation;DME and/or AE instruction;Therapeutic activities;Patient/family education   OT Goals Acute Rehab OT Goals Potential to Achieve Goals: Good ADL Goals Pt Will Perform Grooming: with set-up;Sitting, chair;Sitting, edge of bed;Standing at sink;Supported ADL Goal: Grooming - Progress: Goal set today Pt Will Perform Upper Body Dressing: with supervision;with set-up;Sitting, chair;Sitting, bed;Unsupported ADL Goal: Upper Body Dressing - Progress: Goal set today Pt Will Perform Lower Body Dressing: with min assist;Sit to stand from chair;Sit to stand from bed;Supported;with adaptive equipment ADL Goal: Lower Body Dressing - Progress: Goal set today Pt Will Transfer to Toilet: with min assist;Ambulation;with DME;3-in-1 ADL Goal: Toilet Transfer - Progress: Goal set today Pt Will Perform Toileting - Clothing Manipulation: with min assist;Sitting on 3-in-1 or  toilet;Standing ADL Goal: Toileting - Clothing Manipulation - Progress: Goal set today Pt Will Perform Toileting - Hygiene: with modified independence;Sitting on 3-in-1 or toilet ADL Goal: Toileting - Hygiene - Progress: Goal set today Pt Will Perform Tub/Shower Transfer: Tub transfer;with min assist;Ambulation;with DME;Transfer tub bench;Maintaining  back safety precautions ADL Goal: Tub/Shower Transfer - Progress: Goal set today  Visit Information  Last OT Received On: 10/28/12 Assistance Needed: +2    Subjective Data  Subjective: "I can't move that leg really" re: LLE Patient Stated Goal: Home as able   Prior Functioning     Home Living Lives With: Spouse Available Help at Discharge: Family;Available 24 hours/day Type of Home: Apartment Home Access: Level entry Home Layout: One level Bathroom Shower/Tub: Tub/shower unit;Curtain Firefighter: Standard Bathroom Accessibility: Yes How Accessible: Accessible via walker Home Adaptive Equipment: Walker - rolling;Grab bars in shower Prior Function Level of Independence: Independent (needed extra time to complete all tasks) Able to Take Stairs?: Yes Driving: Yes Vocation: Full time employment Communication Communication: No difficulties Dominant Hand: Right         Vision/Perception Vision - History Baseline Vision: Wears glasses for distance only Patient Visual Report: No change from baseline   Cognition  Cognition Overall Cognitive Status: Appears within functional limits for tasks assessed/performed Arousal/Alertness: Awake/alert Orientation Level: Oriented X4 / Intact Behavior During Session: WFL for tasks performed Cognition - Other Comments: actually smiling a little today    Extremity/Trunk Assessment Right Upper Extremity Assessment RUE ROM/Strength/Tone: Within functional levels RUE Sensation: WFL - Light Touch RUE Coordination: WFL - gross/fine motor Left Upper Extremity Assessment LUE  ROM/Strength/Tone: Within functional levels LUE Sensation: WFL - Light Touch Left Lower Extremity Assessment LLE ROM/Strength/Tone: Deficits LLE ROM/Strength/Tone Deficits: Pt cont to present w/ weakness in bilateral LLE's L>R with transfers and functional mobility Trunk Assessment Trunk Assessment: Normal     Mobility Bed Mobility Bed Mobility: Rolling Left;Left Sidelying to Sit;Sitting - Scoot to [place] of Bed Rolling Left: 4: Min assist;With rail Left Sidelying to Sit: 1: +2 Total assist;HOB flat Left Sidelying to Sit: Patient Percentage: 40% Sitting - Scoot to Edge of Bed: 4: Min assist Sit to Supine: Not Tested (comment) Details for Bed Mobility Assistance: pt. able to tolerate full roll to left and side to sit, requiring 2 assist for moving from side to sit. Transfers Transfers: Sit to Stand;Stand to Sit Sit to Stand: 1: +2 Total assist;With upper extremity assist;From bed Sit to Stand: Patient Percentage: 50% Stand to Sit: 1: +2 Total assist;With armrests;To chair/3-in-1 Stand to Sit: Patient Percentage: 50% Details for Transfer Assistance: Pt. has marked weakness in left LE , making transitions difficult.  Needs support of left LE to position correctly and for blocking knee in standing.     Exercise General Exercises - Lower Extremity Ankle Circles/Pumps: AROM;Left;5 reps;Seated Long Arc Quad: AROM;Left;5 reps;Seated;Other (comment) (initiating quads, pt. unable to fully extend knee)   Balance     End of Session OT - End of Session Equipment Utilized During Treatment: Gait belt;Back brace;Other (comment) (RW) Activity Tolerance: Patient tolerated treatment well Patient left: in chair;with call bell/phone within reach;with family/visitor present  GO     Charletta Cousin, Amy Beth Dixon 10/28/2012, 10:10 AM

## 2012-10-28 NOTE — Clinical Social Work Note (Signed)
Clinical Social Work   CSW received consult for SNF. CSW reviewed chart and discussed pt with RN during progression. Per chart review, PT is recommending HHPT. RNCM is aware and following. CSW is signing off as no further needs identified. Please reconsult if a need arises prior to discharge.   Dede Query, MSW, Theresia Majors [phone]

## 2012-10-29 MED ORDER — DOCUSATE SODIUM 100 MG PO CAPS
100.0000 mg | ORAL_CAPSULE | Freq: Every day | ORAL | Status: DC | PRN
  Administered 2012-10-29 – 2012-11-04 (×4): 100 mg via ORAL
  Filled 2012-10-29 (×3): qty 1

## 2012-10-29 NOTE — Progress Notes (Signed)
Since PCA morphine dc'd this am,  Pt has not complained on headache. Remains very lethargic, and seems unable to stay awake,  Will complain of discomfort from staying in one position very long,  Positions changed, and pt returns quick back to sleep.  Foley cath remains in place.  Pt is still a  Maximum assist to attempt to stand, used Sari left to get patient back to bed .

## 2012-10-29 NOTE — Progress Notes (Signed)
Patient ID: Brenda Haas, female   DOB: April 29, 1951, 62 y.o.   MRN: [id number] BP 128/56  Pulse 84  Temp(Src) 97.8 F (36.6 C) (Oral)  Resp 15  Ht 5\' 8"  (1.727 m)  Wt 85.821 kg (189 lb 3.2 oz)  BMI 28.77 kg/m2  SpO2 100% Alert Says she is in a good deal of pain Drain with a fair amount of fluid will leave in  Weakness in left lower extremity No signs of infection

## 2012-10-29 NOTE — Progress Notes (Signed)
Physical Therapy Treatment Patient Details Name: Brenda Haas MRN: [id number] DOB: [birth year] Today's Date: 10/29/2012 Time: 4098-1191 PT Time Calculation (min): 23 min  PT Assessment / Plan / Recommendation Comments on Treatment Session  Pt very lethargic upon arrival with some increased alertness with activity.    Progressing slowly with mobility at this date.      Follow Up Recommendations  Home health PT;Supervision/Assistance - 24 hour;Supervision for mobility/OOB     Does the patient have the potential to tolerate intense rehabilitation     Barriers to Discharge        Equipment Recommendations  Rolling walker with 5" wheels    Recommendations for Other Services    Frequency Min 5X/week   Plan Discharge plan remains appropriate;Frequency remains appropriate;Other (comment) (as long as pt able to progress adequately)    Precautions / Restrictions Precautions Precautions: Back Precaution Comments: Pt unable to recall any of the 3 back precautions.  Reviewed all 3.   Required Braces or Orthoses: Spinal Brace Spinal Brace: Lumbar corset;Applied in sitting position Restrictions Weight Bearing Restrictions: No   Pertinent Vitals/Pain 10/10 Back.  PCA.  Repositioned.      Mobility  Bed Mobility Bed Mobility: Rolling Right;Right Sidelying to Sit;Sitting - Scoot to [place] of Bed Rolling Right: 2: Max assist;With rail Right Sidelying to Sit: 1: +2 Total assist;With rails Right Sidelying to Sit: Patient Percentage: 40% Sitting - Scoot to Edge of Bed: 3: Mod assist (use of draw pad) Details for Bed Mobility Assistance: Cues to reinforce back precautions & technique.   Increased time required.  Pt c/o significant pain with all aspects of transitioning.   Transfers Transfers: Sit to Stand;Stand to Sit;Stand Pivot Transfers Sit to Stand: 1: +2 Total assist;With upper extremity assist;From bed Sit to Stand: Patient Percentage: 40% Stand to Sit: 1: +2 Total assist;With  armrests;To chair/3-in-1 Stand to Sit: Patient Percentage: 40% Stand Pivot Transfers: 1: +2 Total assist Stand Pivot Transfers: Patient Percentage: 60% Details for Transfer Assistance: Pt with significant difficulty achieve hip/knee extension.  Max directional cues for safety & technique.  Faciliation provided above knee to promote knee extension (pt able to get Rt knee extended without faciliation but increased time).   Faciliation remained above knee during bed>recliner pivot to prevent Lt knee buckling.  Pt unable to clear floor with Lt LE.     Ambulation/Gait Ambulation/Gait Assistance: 1: +2 Total assist Ambulation/Gait: Patient Percentage: 60% Ambulation Distance (Feet):  (4 steps forwards) Assistive device: Rolling walker Ambulation/Gait Assistance Details: Max directional cues for technique.  Faciliation for tall posture, preventing Lt knee buckling, & (A) for balance & safety.      Gait Pattern: Step-to pattern;Decreased step length - right;Decreased step length - left;Decreased stance time - left;Decreased hip/knee flexion - left;Decreased weight shift to left;Trunk flexed Stairs: No Wheelchair Mobility Wheelchair Mobility: No    Exercises General Exercises - Lower Extremity Ankle Circles/Pumps: AROM;Both;10 reps Heel Slides: AAROM;Strengthening;AROM;Both;5 reps;Supine (AAROM Lt LE)    PT Goals Acute Rehab PT Goals Time For Goal Achievement: 11/03/12 Potential to Achieve Goals: Good Pt will Roll Supine to Right Side: with modified independence PT Goal: Rolling Supine to Right Side - Progress: Progressing toward goal Pt will Roll Supine to Left Side: with modified independence Pt will go Supine/Side to Sit: with supervision PT Goal: Supine/Side to Sit - Progress: Not met Pt will Sit at [facility] of Bed: with modified independence;1-2 min Pt will go Sit to Supine/Side: with supervision Pt will go Sit to Stand:  with supervision;with upper extremity assist PT Goal: Sit to Stand -  Progress: Not progressing Pt will go Stand to Sit: with supervision PT Goal: Stand to Sit - Progress: Not progressing Pt will Ambulate: 51 - 150 feet;with supervision;with least restrictive assistive device PT Goal: Ambulate - Progress: Progressing toward goal Additional Goals Additional Goal #1: Pt. will state and adhere to 3/3 back precautions and log rolling technique PT Goal: Additional Goal #1 - Progress: Not progressing  Visit Information  Last PT Received On: 10/29/12 Assistance Needed: +2    Subjective Data      Cognition  Cognition Overall Cognitive Status: Appears within functional limits for tasks assessed/performed Arousal/Alertness: Lethargic Orientation Level: Appears intact for tasks assessed Behavior During Session: Lethargic Cognition - Other Comments: Lethargic due to PCA???  Increased alertness with activity.      Balance  Balance Balance Assessed: Yes Static Sitting Balance Static Sitting - Balance Support: Feet supported;Bilateral upper extremity supported Static Sitting - Level of Assistance: 5: Stand by assistance;4: Min assist Static Sitting - Comment/# of Minutes: Occassional (A) to prevent LOB backwards-- pt lethargic at this time  End of Session PT - End of Session Equipment Utilized During Treatment: Gait belt;Back brace Activity Tolerance: Other (comment);Patient limited by pain (lethargic) Patient left: in chair;with call bell/phone within reach Nurse Communication: Mobility status     Verdell Face, [place] [phone] 10/29/2012

## 2012-10-30 LAB — TYPE AND SCREEN
ABO/RH(D): B POS
Antibody Screen: NEGATIVE
Unit division: 0

## 2012-10-30 MED ORDER — INFLUENZA VIRUS VACC SPLIT PF IM SUSP
0.5000 mL | INTRAMUSCULAR | Status: AC
  Filled 2012-10-30: qty 0.5

## 2012-10-30 MED ORDER — PNEUMOCOCCAL VAC POLYVALENT 25 MCG/0.5ML IJ INJ
0.5000 mL | INJECTION | INTRAMUSCULAR | Status: AC
  Administered 2012-10-31: 0.5 mL via INTRAMUSCULAR
  Filled 2012-10-30: qty 0.5

## 2012-10-30 NOTE — Progress Notes (Signed)
Patient ID: Brenda Haas, female   DOB: 04/08/1951, 62 y.o.   MRN: [id number] Still a lot of incisional pain,some weakness proximally, sensory normal, drain out.

## 2012-10-30 NOTE — Progress Notes (Signed)
Physical Therapy Treatment Patient Details Name: Brenda Haas MRN: [id number] DOB: [birth year] Today's Date: 10/30/2012 Time: 4696-2952 PT Time Calculation (min): 36 min  PT Assessment / Plan / Recommendation Comments on Treatment Session  Pt still slightly lethargic but oriented & following cues well.  Pt was able to progress with mobility as evident in ambulated ~60' today but cont's to require signficant (A) for bed mobility & sit<>stand transfers.  Pt only tolerating sitting in recliner x ~15 mins then adamantly requesting to get back into bed.  RN administered pain medication at this time.      Follow Up Recommendations  Home health PT;Supervision/Assistance - 24 hour;Supervision for mobility/OOB     Does the patient have the potential to tolerate intense rehabilitation     Barriers to Discharge        Equipment Recommendations  Rolling walker with 5" wheels    Recommendations for Other Services    Frequency Min 5X/week   Plan      Precautions / Restrictions Precautions Precautions: Back Precaution Comments: Pt able to recall all 3 back precautions Required Braces or Orthoses: Spinal Brace Spinal Brace: Lumbar corset;Applied in sitting position Restrictions Weight Bearing Restrictions: No   Pertinent Vitals/Pain 10/10 back.        Mobility  Bed Mobility Bed Mobility: Rolling Left;Left Sidelying to Sit;Sitting - Scoot to Edge of Bed Rolling Left: 4: Min assist;With rail Left Sidelying to Sit: 2: Max assist Sitting - Scoot to [place] of Bed: 4: Min assist Details for Bed Mobility Assistance: Cues for sequencing & technique.   Significant (A) to lift shoulders/trunk to sitting upright & move LE's to EOB/OOB.   Increased time required for all transitional movements Transfers Transfers: Sit to Stand;Stand to Sit Sit to Stand: 1: +2 Total assist;With upper extremity assist;From bed Sit to Stand: Patient Percentage: 40% Stand to Sit: 1: +2 Total assist;With upper extremity  assist;With armrests;To chair/3-in-1 Stand to Sit: Patient Percentage: 40% Details for Transfer Assistance: Cont's to require +2 (A) to achieve standing.   Ambulation/Gait Ambulation/Gait Assistance: 1: +2 Total assist (+2 to follow with recliner ) Ambulation/Gait: Patient Percentage: 90% Ambulation Distance (Feet): 60 Feet Assistive device: Rolling walker Ambulation/Gait Assistance Details: Cues for tall posture, body positioning inside RW, safe RW advancement/management, & hand placement on RW.   No knee buckling noted.   Gait Pattern: Step-to pattern;Decreased step length - right;Decreased step length - left;Decreased hip/knee flexion - right;Decreased hip/knee flexion - left Stairs: No Wheelchair Mobility Wheelchair Mobility: No      PT Goals Acute Rehab PT Goals Time For Goal Achievement: 11/03/12 Potential to Achieve Goals: Good Pt will Roll Supine to Right Side: with modified independence Pt will Roll Supine to Left Side: with modified independence PT Goal: Rolling Supine to Left Side - Progress: Progressing toward goal Pt will go Supine/Side to Sit: with supervision PT Goal: Supine/Side to Sit - Progress: Not met Pt will Sit at [facility] of Bed: with modified independence;1-2 min Pt will go Sit to Supine/Side: with supervision Pt will go Sit to Stand: with supervision;with upper extremity assist PT Goal: Sit to Stand - Progress: Not progressing Pt will go Stand to Sit: with supervision PT Goal: Stand to Sit - Progress: Not progressing Pt will Ambulate: 51 - 150 feet;with supervision;with least restrictive assistive device PT Goal: Ambulate - Progress: Progressing toward goal Additional Goals Additional Goal #1: Pt. will state and adhere to 3/3 back precautions and log rolling technique PT Goal: Additional Goal #1 -  Progress: Progressing toward goal  Visit Information  Last PT Received On: 10/30/12 Assistance Needed: +2    Subjective Data      Cognition   Cognition Overall Cognitive Status: Appears within functional limits for tasks assessed/performed Arousal/Alertness: Lethargic Orientation Level: Appears intact for tasks assessed Behavior During Session: Lethargic Cognition - Other Comments: Pt cont's to be slightly lethargic but oriented x 4 & follows cues well.      Balance  Balance Balance Assessed: No  End of Session PT - End of Session Equipment Utilized During Treatment: Gait belt;Back brace Activity Tolerance: Patient tolerated treatment well Patient left: in chair;with call bell/phone within reach;with family/visitor present Nurse Communication: Mobility status     Verdell Face, [place] [phone] 10/30/2012

## 2012-10-30 NOTE — Progress Notes (Signed)
Patient complains of stable back pain and lower extremity weakness left greater than right. No new issues overnight. Beginning to make some progress with therapy.  She is afebrile. Her vitals are stable. Urine output is good. Patient still requiring catheter for urinary retention. Motor and sensory exam stable. Dressing dry. Chest and abdomen benign.  Progressing slowly following multilevel decompression and fusion surgery. Continue rehabilitation efforts. Patient may need inpatient rehabilitation versus skilled nursing facility postop.

## 2012-10-31 MED ORDER — FLEET ENEMA 7-19 GM/118ML RE ENEM: 1.0000 | ENEMA | Freq: Every day | RECTAL | Status: DC | PRN

## 2012-10-31 MED FILL — Sodium Chloride IV Soln 0.9%: INTRAVENOUS | Qty: 3000 | Status: AC

## 2012-10-31 MED FILL — Heparin Sodium (Porcine) Inj 1000 Unit/ML: INTRAMUSCULAR | Qty: 30 | Status: AC

## 2012-10-31 NOTE — Progress Notes (Signed)
Physical Therapy Treatment Patient Details Name: Brenda Haas MRN: [id number] DOB: [birth year] Today's Date: 10/31/2012 Time: 7829-5621 PT Time Calculation (min): 35 min  PT Assessment / Plan / Recommendation Comments on Treatment Session  Pt is progressing with mobility but cont's to have difficulty with bed mobiltiy + sit<>stand transfers.   Pt still slightly drowsy throughout session with having difficulty keeping eyes open entire session.  Spoke with pt Re: d/c plans.  Pt may benefit from ST-SNF prior to d/c home to maximize functional mobility & safety.  Pt agreeable.       Follow Up Recommendations  Home health PT;Supervision/Assistance - 24 hour;Other (comment) (may need SNF if does not progress)     Does the patient have the potential to tolerate intense rehabilitation     Barriers to Discharge        Equipment Recommendations  Rolling walker with 5" wheels    Recommendations for Other Services    Frequency Min 5X/week   Plan Discharge plan remains appropriate (may benefit from ST-SNF )    Precautions / Restrictions Precautions Precautions: Back Precaution Comments: Pt verbalizes 3/3 back precautions .  Requires occassional cueing to reinforce precautions with functional mobility.   Required Braces or Orthoses: Spinal Brace Spinal Brace: Lumbar corset;Applied in sitting position Restrictions Weight Bearing Restrictions: No   Pertinent Vitals/Pain C/o pain in back & Rt hip/LE but did not rate.  Premedicated.      Mobility  Bed Mobility Bed Mobility: Right Sidelying to Sit;Sitting - Scoot to Edge of Bed Right Sidelying to Sit: 3: Mod assist;HOB flat;With rails Details for Bed Mobility Assistance: Cues for sequencing & technique.  (A) to lift shoulders/trunk to sitting upright from sidelying position.  Cues for use of UE's to scoot hips closer to EOB.   Transfers Transfers: Sit to Stand;Stand to Sit Sit to Stand: 1: +2 Total assist;With upper extremity assist;With  armrests;From chair/3-in-1;From bed Sit to Stand: Patient Percentage: 40% Stand to Sit: 2: Max assist;With armrests;To chair/3-in-1 Details for Transfer Assistance: Max directional cues for hand placement & technique.  Pt sits with uncontrolled descent despite cues for use of UE's to (A) with control.  Cont's to require increased time to achieve standing upright-- difficulty achieving hip/knee extension.   Ambulation/Gait Ambulation/Gait Assistance: 4: Min assist (2nd person follow with recliner- pt needed seated rest break) Ambulation Distance (Feet): 160 Feet (80' + 13' ) Assistive device: Rolling walker Ambulation/Gait Assistance Details: Tactile & Verbal cues for tall posture, body positioning inside RW, & safe RW management.    Pt reports Lt side weakness + Rt side pain.  She also states Lt LE buckled 1x.  Pt relies heavily on RW with UE's.   Gait Pattern: Step-through pattern;Decreased step length - right;Decreased step length - left;Decreased stride length;Trunk flexed;Decreased weight shift to left;Decreased weight shift to right (decreased step height.  ) Stairs: No Wheelchair Mobility Wheelchair Mobility: No      PT Goals Acute Rehab PT Goals Time For Goal Achievement: 11/03/12 Potential to Achieve Goals: Good Pt will Roll Supine to Right Side: with modified independence Pt will Roll Supine to Left Side: with modified independence Pt will go Supine/Side to Sit: with supervision PT Goal: Supine/Side to Sit - Progress: Progressing toward goal Pt will Sit at Edge of Bed: with modified independence;1-2 min Pt will go Sit to Supine/Side: with supervision Pt will go Sit to Stand: with supervision;with upper extremity assist PT Goal: Sit to Stand - Progress: Not progressing Pt will go  Stand to Sit: with supervision PT Goal: Stand to Sit - Progress: Not progressing Pt will Ambulate: 51 - 150 feet;with supervision;with least restrictive assistive device PT Goal: Ambulate - Progress:  Progressing toward goal Additional Goals Additional Goal #1: Pt. will state and adhere to 3/3 back precautions and log rolling technique PT Goal: Additional Goal #1 - Progress: Progressing toward goal  Visit Information  Last PT Received On: 10/31/12 Assistance Needed: +2    Subjective Data      Cognition  Cognition Overall Cognitive Status: Appears within functional limits for tasks assessed/performed Arousal/Alertness: Lethargic Orientation Level: Appears intact for tasks assessed Behavior During Session: Other (comment) (cont's to be slightly drowsy) Cognition - Other Comments: Pt cont's to be slightly lethargic but oriented x 4 & follows cues well.      Balance     End of Session PT - End of Session Equipment Utilized During Treatment: Gait belt;Back brace Activity Tolerance: Patient tolerated treatment well Patient left: in chair;with call bell/phone within reach;with nursing in room;with family/visitor present     Verdell Face, [place] [phone] 10/31/2012

## 2012-10-31 NOTE — Progress Notes (Signed)
MD paged regarding 101.7 temperature. No new orders received. Will continue to monitor.

## 2012-10-31 NOTE — Progress Notes (Signed)
Patient ID: Brenda Haas, female   DOB: [birth year], 62 y.o.   MRN: [id number] Wound dry, more alert. Constipated. Rehabilitation to see

## 2012-10-31 NOTE — Progress Notes (Signed)
Physical Therapy Treatment Patient Details Name: Brenda Haas MRN: [id number] DOB: 06-23-1951 Today's Date: 10/31/2012 Time: 4132-4401 PT Time Calculation (min): 35 min  PT Assessment / Plan / Recommendation Comments on Treatment Session  Pt is progressing with mobility but cont's to have difficulty with bed mobiltiy + sit<>stand transfers.   Pt still slightly drowsy throughout session with having difficulty keeping eyes open entire session.  Spoke with pt Re: d/c plans.  Pt may benefit from ST-SNF prior to d/c home to maximize functional mobility & safety.  Pt agreeable.       Follow Up Recommendations  Home health PT;Supervision/Assistance - 24 hour;Other (comment) (may need SNF if does not progress)     Does the patient have the potential to tolerate intense rehabilitation     Barriers to Discharge        Equipment Recommendations  Rolling walker with 5" wheels    Recommendations for Other Services    Frequency Min 5X/week   Plan Discharge plan remains appropriate (may benefit from ST-SNF )    Precautions / Restrictions Precautions Precautions: Back Precaution Comments: Pt verbalizes 3/3 back precautions .  Requires occassional cueing to reinforce precautions with functional mobility.   Required Braces or Orthoses: Spinal Brace Spinal Brace: Lumbar corset;Applied in sitting position Restrictions Weight Bearing Restrictions: No   Pertinent Vitals/Pain C/o pain in back & Rt hip/LE but did not rate.  Premedicated.      Mobility  Bed Mobility Bed Mobility: Right Sidelying to Sit;Sitting - Scoot to Edge of Bed Right Sidelying to Sit: 3: Mod assist;HOB flat;With rails Details for Bed Mobility Assistance: Cues for sequencing & technique.  (A) to lift shoulders/trunk to sitting upright from sidelying position.  Cues for use of UE's to scoot hips closer to EOB.   Transfers Transfers: Sit to Stand;Stand to Sit Sit to Stand: 1: +2 Total assist;With upper extremity assist;With  armrests;From chair/3-in-1;From bed Sit to Stand: Patient Percentage: 40% Stand to Sit: 2: Max assist;With armrests;To chair/3-in-1 Details for Transfer Assistance: Max directional cues for hand placement & technique.  Pt sits with uncontrolled descent despite cues for use of UE's to (A) with control.  Cont's to require increased time to achieve standing upright-- difficulty achieving hip/knee extension.   Ambulation/Gait Ambulation/Gait Assistance: 4: Min assist (2nd person follow with recliner- pt needed seated rest break) Ambulation Distance (Feet): 160 Feet (80' + 98' ) Assistive device: Rolling walker Ambulation/Gait Assistance Details: Tactile & Verbal cues for tall posture, body positioning inside RW, & safe RW management.    Pt reports Lt side weakness + Rt side pain.  She also states Lt LE buckled 1x.  Pt relies heavily on RW with UE's.   Gait Pattern: Step-through pattern;Decreased step length - right;Decreased step length - left;Decreased stride length;Trunk flexed;Decreased weight shift to left;Decreased weight shift to right (decreased step height.  ) Stairs: No Wheelchair Mobility Wheelchair Mobility: No      PT Goals Acute Rehab PT Goals Time For Goal Achievement: 11/03/12 Potential to Achieve Goals: Good Pt will Roll Supine to Right Side: with modified independence Pt will Roll Supine to Left Side: with modified independence Pt will go Supine/Side to Sit: with supervision PT Goal: Supine/Side to Sit - Progress: Progressing toward goal Pt will Sit at Edge of Bed: with modified independence;1-2 min Pt will go Sit to Supine/Side: with supervision Pt will go Sit to Stand: with supervision;with upper extremity assist PT Goal: Sit to Stand - Progress: Not progressing Pt will go  Stand to Sit: with supervision PT Goal: Stand to Sit - Progress: Not progressing Pt will Ambulate: 51 - 150 feet;with supervision;with least restrictive assistive device PT Goal: Ambulate - Progress:  Progressing toward goal Additional Goals Additional Goal #1: Pt. will state and adhere to 3/3 back precautions and log rolling technique PT Goal: Additional Goal #1 - Progress: Progressing toward goal  Visit Information  Last PT Received On: 10/31/12 Assistance Needed: +2    Subjective Data      Cognition  Cognition Overall Cognitive Status: Appears within functional limits for tasks assessed/performed Arousal/Alertness: Lethargic Orientation Level: Appears intact for tasks assessed Behavior During Session: Other (comment) (cont's to be slightly drowsy) Cognition - Other Comments: Pt cont's to be slightly lethargic but oriented x 4 & follows cues well.      Balance     End of Session PT - End of Session Equipment Utilized During Treatment: Gait belt;Back brace Activity Tolerance: Patient tolerated treatment well Patient left: in chair;with call bell/phone within reach;with nursing in room;with family/visitor present     Verdell Face, [place] [phone] 10/31/2012  Agree with the above assessment.  Lewis Shock, PT, DPT Pager #: [phone] Office #: [phone]

## 2012-11-01 MED ORDER — GABAPENTIN 100 MG PO CAPS
100.0000 mg | ORAL_CAPSULE | Freq: Three times a day (TID) | ORAL | Status: DC
  Administered 2012-11-01 – 2012-11-03 (×5): 100 mg via ORAL
  Filled 2012-11-01 (×8): qty 1

## 2012-11-01 MED ORDER — FLEET ENEMA 7-19 GM/118ML RE ENEM
1.0000 | ENEMA | Freq: Every day | RECTAL | Status: DC | PRN
  Administered 2012-11-01 – 2012-11-03 (×2): 1 via RECTAL
  Administered 2012-11-05: 07:00:00 via RECTAL
  Administered 2012-11-07: 1 via RECTAL
  Filled 2012-11-01 (×4): qty 1

## 2012-11-01 NOTE — Progress Notes (Signed)
Occupational Therapy Treatment Patient Details Name: Brenda Haas MRN: [id number] DOB: 1951/01/02 Today's Date: 11/01/2012 Time: 1140-1210 OT Time Calculation (min): 30 min  OT Assessment / Plan / Recommendation Comments on Treatment Session Pt is progressing slowly in mobility and plan is now for SNF for ST rehab.      Follow Up Recommendations  SNF    Barriers to Discharge       Equipment Recommendations       Recommendations for Other Services    Frequency Min 2X/week   Plan Discharge plan needs to be updated    Precautions / Restrictions Precautions Precautions: Back;Fall Precaution Comments: Pt verbalized all back precautions, cues for generalizing. Required Braces or Orthoses: Spinal Brace Spinal Brace: Lumbar corset;Applied in sitting position Restrictions Weight Bearing Restrictions: No   Pertinent Vitals/Pain Legs, back, did not rate, repositioned, premedicated    ADL  Grooming: Wash/dry hands;Wash/dry face;Set up Where Assessed - Grooming: Unsupported sitting Lower Body Bathing: Maximal assistance (instructed in use of long handled sponge) Where Assessed - Lower Body Bathing: Unsupported sitting Upper Body Dressing: Set up Where Assessed - Upper Body Dressing: Unsupported sitting (back brace) Lower Body Dressing: Moderate assistance (instructed in use of reacher, sock aide, and long shoe horn) Where Assessed - Lower Body Dressing: Unsupported sitting Transfers/Ambulation Related to ADLs: Did not transfer pt from bed, worked EOB only.    OT Diagnosis:    OT Problem List:   OT Treatment Interventions:     OT Goals ADL Goals Pt Will Perform Grooming: with set-up;Sitting, chair;Sitting, edge of bed;Standing at sink;Supported ADL Goal: Grooming - Progress: Met Pt Will Perform Upper Body Dressing: with supervision;with set-up;Sitting, chair;Sitting, bed;Unsupported ADL Goal: Upper Body Dressing - Progress: Progressing toward goals Pt Will Perform Lower  Body Dressing: with min assist;Sit to stand from chair;Sit to stand from bed;Supported;with adaptive equipment ADL Goal: Lower Body Dressing - Progress: Progressing toward goals Pt Will Transfer to Toilet: with min assist;Ambulation;with DME;3-in-1 Pt Will Perform Toileting - Clothing Manipulation: with min assist;Sitting on 3-in-1 or toilet;Standing Pt Will Perform Toileting - Hygiene: with modified independence;Sitting on 3-in-1 or toilet Pt Will Perform Tub/Shower Transfer: Tub transfer;with min assist;Ambulation;with DME;Transfer tub bench;Maintaining back safety precautions  Visit Information  Last OT Received On: 11/01/12 Assistance Needed: +2    Subjective Data      Prior Functioning       Cognition  Cognition Overall Cognitive Status: Appears within functional limits for tasks assessed/performed Arousal/Alertness: Awake/alert Orientation Level: Appears intact for tasks assessed Behavior During Session: [facility] for tasks performed    Mobility  Bed Mobility Bed Mobility: Rolling Left;Left Sidelying to Sit;Sitting - Scoot to [place] of Bed;Sit to Sidelying Left Rolling Left: 4: Min assist;With rail Left Sidelying to Sit: 3: Mod assist;With rails;HOB flat Sitting - Scoot to Edge of Bed: 4: Min guard Sit to Sidelying Left: 3: Mod assist;HOB flat;With rail Details for Bed Mobility Assistance: Cues for sequencing for hand placement & technique  & to reinforce back precautions. .   Transfers Transfers: Not assessed    Exercises      Balance Static Sitting Balance Static Sitting - Balance Support: Feet supported;No upper extremity supported Static Sitting - Level of Assistance: 5: Stand by assistance Static Sitting - Comment/# of Minutes: 5   End of Session OT - End of Session Activity Tolerance: Patient limited by pain Patient left: in bed;with call bell/phone within reach  GO     Evern Bio 11/01/2012, 12:40 PM [phone]

## 2012-11-01 NOTE — Progress Notes (Signed)
Patient ID: Brenda Haas, female   DOB: Apr 29, 1951, 62 y.o.   MRN: [id number] C/o constipation. Wound dry. Pain in paralumbar area. Still some difficulties with gait and balance but slowly getting better.foley to be discontinue. i am requesting for a formal rehabilitation medicine consult  to see if her insurance [employer] allows her to be transfer to that facility to improve her outcome after 4 levels lumbar fusion.

## 2012-11-01 NOTE — Progress Notes (Signed)
1815: I/O cath 500 cc cloudy yellow urine. Hedy Camara

## 2012-11-01 NOTE — Progress Notes (Signed)
I spoke with Dr. Jeral Fruit, [place] health care will not approve an inpt rehab stay. Recommend SNF. [phone]

## 2012-11-01 NOTE — Progress Notes (Addendum)
Fleet's enema given at 1237, no results. Rectal vault clear. 8.6 mg prn senna given to patient at 1258. Foley catheter also discontinued at 1230; will perform I/O catheterization if no void by 1830 per orders.

## 2012-11-01 NOTE — Progress Notes (Signed)
Physical Therapy Treatment Patient Details Name: Brenda Haas MRN: [id number] DOB: Dec 01, 1950 Today's Date: 11/01/2012 Time: 4098-1191 PT Time Calculation (min): 37 min  PT Assessment / Plan / Recommendation Comments on Treatment Session  Pt still reports she would benefit from ST-SNF prior to d/c home.  Pt still requires significant (A) supine to sit & sit to stand.  Pt showing gradual improvement with gait.    Follow Up Recommendations  SNF;Home health PT;Supervision/Assistance - 24 hour (Pt would benefit from SNF)     Does the patient have the potential to tolerate intense rehabilitation     Barriers to Discharge        Equipment Recommendations  Rolling walker with 5" wheels    Recommendations for Other Services    Frequency Min 5X/week   Plan      Precautions / Restrictions Precautions Precautions: Back Precaution Comments: Pt able to verbalize 3/3 precautions, still needs occasional cues to reinforce precautions with bed mobility & sit to stand. Required Braces or Orthoses: Spinal Brace Spinal Brace: Lumbar corset;Applied in sitting position Restrictions Weight Bearing Restrictions: No   Pertinent Vitals/Pain Checked with RN prior to session.  Pt not due for pain meds at this time.  Pt c/o of pain in rt leg and incisional pain.  Ice pack applied end of session.    Mobility  Bed Mobility Bed Mobility: Rolling Left;Left Sidelying to Sit;Sitting - Scoot to Edge of Bed Rolling Left: 4: Min assist;With rail Left Sidelying to Sit: 4: Min assist;3: Mod assist;HOB flat Sitting - Scoot to Edge of Bed: 4: Min guard Details for Bed Mobility Assistance: Cues for sequencing for hand placement & technique  & to reinforce back precautions. Pt required assistance to maintain log rolling supine to side, Min (A) to initiate side to sit and Mod (A) to complete upright sitting.   Transfers Transfers: Sit to Stand;Stand to Sit Sit to Stand: 4: Min assist;1: +2 Total assist;With upper  extremity assist;With armrests;From bed;From chair/3-in-1 Sit to Stand: Patient Percentage: 40% Stand to Sit: 2: Max assist;With upper extremity assist;With armrests;To chair/3-in-1;To bed Details for Transfer Assistance: Min (A) sit to stand from elevated bed and +2 (A) to achieve standing from recliner. Max cues for hand placement and foot placement.  Significant difficulty pushing through LE's to achieve upright position due to pain & weakness per Pt.  Ambulation/Gait Ambulation/Gait Assistance: 4: Min guard Ambulation Distance (Feet): 200 Feet Assistive device: Rolling walker Ambulation/Gait Assistance Details: Cues for increased step/stride length, encouragement to increase gait speed.  Cues to relax UE's-- Pt with rigid stiff posture.  Pt relies heavily on RW with UE's for support.  Encouragement to decrease reliance of UE's on RW. Gait Pattern: Step-through pattern;Decreased step length - right;Decreased step length - left;Decreased stride length;Trunk flexed;Decreased weight shift to left;Decreased weight shift to right Gait velocity: decreased General Gait Details: Pt was able to increase gait speed when cued. Stairs: No Wheelchair Mobility Wheelchair Mobility: No      PT Goals Acute Rehab PT Goals Time For Goal Achievement: 11/03/12 Potential to Achieve Goals: Good Pt will Roll Supine to Right Side: with modified independence Pt will Roll Supine to Left Side: with modified independence PT Goal: Rolling Supine to Left Side - Progress: Progressing toward goal Pt will go Supine/Side to Sit: with supervision PT Goal: Supine/Side to Sit - Progress: Progressing toward goal Pt will Sit at Edge of Bed: with modified independence;1-2 min PT Goal: Sit at Edge Of Bed - Progress: Progressing toward  goal Pt will go Sit to Supine/Side: with supervision Pt will go Sit to Stand: with supervision;with upper extremity assist PT Goal: Sit to Stand - Progress: Not progressing Pt will go Stand  to Sit: with supervision PT Goal: Stand to Sit - Progress: Not progressing Pt will Ambulate: 51 - 150 feet;with supervision;with least restrictive assistive device PT Goal: Ambulate - Progress: Progressing toward goal Additional Goals Additional Goal #1: Pt. will state and adhere to 3/3 back precautions and log rolling technique PT Goal: Additional Goal #1 - Progress: Progressing toward goal  Visit Information  Last PT Received On: 11/01/12 Assistance Needed: +2    Subjective Data      Cognition  Cognition Overall Cognitive Status: Appears within functional limits for tasks assessed/performed Arousal/Alertness: Awake/alert Orientation Level: Appears intact for tasks assessed Behavior During Session: [facility] for tasks performed    Balance  Static Sitting Balance Static Sitting - Balance Support: Feet supported;No upper extremity supported Static Sitting - Level of Assistance: 5: Stand by assistance Static Sitting - Comment/# of Minutes: With initial sitting pt relied heavily on UE's for support, with verbal cues pt able to sit upright with hands in lap.  End of Session PT - End of Session Equipment Utilized During Treatment: Gait belt;Back brace Activity Tolerance: Patient tolerated treatment well;Patient limited by pain Patient left: in chair;with call bell/phone within reach Nurse Communication: Mobility status   Verdell Face, [place] [phone] 11/01/2012

## 2012-11-01 NOTE — Progress Notes (Signed)
Patient ID: Brenda Haas, female   DOB: 01-02-1951, 62 y.o.   MRN: [id number] i just finished talking with Britta Mccreedy, rehabilitation rn. [employer] will not approve sbu a snf. See orders

## 2012-11-01 NOTE — Clinical Social Work Psychosocial (Signed)
Clinical Social Work Department BRIEF PSYCHOSOCIAL ASSESSMENT 11/01/2012  Patient:  Brenda Haas, Brenda Haas     Account Number:  [id number]     Admit date:  10/26/2012  Clinical Social Worker:  Peggyann Shoals  Date/Time:  11/01/2012 04:46 PM  Referred by:  Physician  Date Referred:  11/01/2012 Referred for  SNF Placement   Other Referral:   Interview type:  Patient Other interview type:    PSYCHOSOCIAL DATA Living Status:  HUSBAND Admitted from facility:   Level of care:   Primary support name:  Viviann Spare Seymore Primary support relationship to patient:  SPOUSE Degree of support available:   Supportive.    CURRENT CONCERNS Current Concerns  Post-Acute Placement   Other Concerns:    SOCIAL WORK ASSESSMENT / PLAN CSW met with pt to address consult for SNF. CSW introduced herself and explained role of social work. CSW also explained the process of discharging to a SNF with private insurance, as well as a private pay.    Pt lives at home with spouse. Pt shared that she is trying to find out if a neighbor could check in with pt after she discharged, in the event of SNF is not an option.    CSW will initiate SNF search and follow up with bed offers. CSW also follow up with insurance company regarding   Assessment/plan status:  Psychosocial Support/Ongoing Assessment of Needs Other assessment/ plan:   Information/referral to community resources:   SNF list    PATIENT'S/FAMILY'S RESPONSE TO PLAN OF CARE: Pt was alert and oriented and agreeable to SNF referral, however is looking at back up for discharge.    Dede Query, MSW, Theresia Majors [phone]

## 2012-11-01 NOTE — Progress Notes (Signed)
Patient ID: Brenda Haas, female   DOB: 23-Feb-1951, 62 y.o.   MRN: [id number] Han enema. No results so far. Did a rectal examination under the presence of [name], Charity fundraiser. Normal rectal tone. Moving more .sensory normal

## 2012-11-02 ENCOUNTER — Inpatient Hospital Stay (HOSPITAL_COMMUNITY): Payer: BC Managed Care – PPO

## 2012-11-02 MED ORDER — GADOBENATE DIMEGLUMINE 529 MG/ML IV SOLN
19.0000 mL | Freq: Once | INTRAVENOUS | Status: AC
  Administered 2012-11-02: 19 mL via INTRAVENOUS

## 2012-11-02 MED ORDER — MORPHINE SULFATE 2 MG/ML IJ SOLN
2.0000 mg | INTRAMUSCULAR | Status: DC | PRN
  Administered 2012-11-02 – 2012-11-03 (×3): 2 mg via INTRAVENOUS
  Administered 2012-11-03: 4 mg via INTRAVENOUS
  Administered 2012-11-03 – 2012-11-05 (×8): 2 mg via INTRAVENOUS
  Filled 2012-11-02: qty 1
  Filled 2012-11-02: qty 2
  Filled 2012-11-02 (×3): qty 1
  Filled 2012-11-02: qty 2
  Filled 2012-11-02 (×2): qty 1
  Filled 2012-11-02: qty 2
  Filled 2012-11-02 (×5): qty 1

## 2012-11-02 MED ORDER — SODIUM CHLORIDE 0.9 % IV SOLN
INTRAVENOUS | Status: DC
  Administered 2012-11-02 – 2012-11-04 (×6): via INTRAVENOUS

## 2012-11-02 MED ORDER — MAGIC MOUTHWASH
10.0000 mL | Freq: Four times a day (QID) | ORAL | Status: DC | PRN
  Administered 2012-11-02: 10 mL via ORAL
  Filled 2012-11-02: qty 10

## 2012-11-02 MED ORDER — DIAZEPAM 5 MG/ML IJ SOLN
5.0000 mg | Freq: Four times a day (QID) | INTRAMUSCULAR | Status: DC | PRN
  Administered 2012-11-02 – 2012-11-06 (×5): 5 mg via INTRAVENOUS
  Filled 2012-11-02 (×5): qty 2

## 2012-11-02 MED ORDER — MORPHINE SULFATE 4 MG/ML IJ SOLN: 4.0000 mg | INTRAMUSCULAR | Status: DC | PRN

## 2012-11-02 NOTE — Progress Notes (Signed)
Patient ID: Brenda Haas, female   DOB: 10/08/1950, 62 y.o.   MRN: [id number] I saw the mri of the lumbar spine on ms Laye. Went to her room and did another physical examination on her. She complains of burning pain proximally in both thighs and pain with flexion of them. Abductors and adductors as well as the quadriceps and df and pf seems to be of normal strength while she is resting in bed. Sensory is normal bilaterally and reflexes are normal. Ractal tone present. She now has some blisters in her lips and guns. No headache. Wound is dry with some paralumbar tenderness. The lumbar mri shows some fluid collection wild mild compromise of the thecal sac which for 1 week post op after her 4 level fusion is not unusual. The pedicles screws are in good positions and so the cages. While DR Newell Coral and Dr Franky Macho where in surgery i asked them to see the mri and they felt the fluid collection at this stage is expected and agree with no exploration. I spoke with ms Buick at length. We are going to continue with pt/ot. We are looking for placement. Husband was not around while i spoke with her

## 2012-11-02 NOTE — Progress Notes (Signed)
0030: Bladder scan revealed urine. Patient ambulated to [facility] and attempted to urinate for 15-20 minutes. No success. Patient was put back in bed and I/O cath performed draining cloudy yellow urine, slightly malodorous. Fluids encouraged, will continue to monitor.

## 2012-11-02 NOTE — Progress Notes (Signed)
MRI results called to Dr. Jeral Fruit who is in the OR. Also, notified him of pt. C/o severe leg pains, and nausea. Per pt. Valium and Percocet not helping, was just given Percocet. Received new orders. Will continue to monitor.

## 2012-11-02 NOTE — Progress Notes (Signed)
PT Cancellation Note  Patient Details Name: Brenda Haas MRN: [id number] DOB: 06/30/1951   Cancelled Treatment:     RN requesting to hold therapy.  Pt awaiting MRI of spine.  Will f/u tomorrow.     Verdell Face, [place] [phone] 11/02/2012

## 2012-11-02 NOTE — Progress Notes (Signed)
Patient ID: Brenda Haas, female   DOB: Sep 24, 1950, 62 y.o.   MRN: [id number] Patient still with proximal pain in both legs. Still with some weakness proximally in both legs but distally normal. Unable to void on her own. Rectal tone normal. i am getting a lumbar mri to role out the possibility of fluid collection in epidural space.

## 2012-11-02 NOTE — Clinical Social Work Placement (Signed)
Clinical Social Work Department CLINICAL SOCIAL WORK PLACEMENT NOTE 11/02/2012  Patient:  Brenda Haas, Brenda Haas  Account Number:  [id number] Admit date:  10/26/2012  Clinical Social Worker:  Peggyann Shoals  Date/time:  11/02/2012 11:43 AM  Clinical Social Work is seeking post-discharge placement for this patient at the following level of care:   SKILLED NURSING   (*CSW will update this form in Epic as items are completed)   11/01/2012  Patient/family provided with [facility] Department of Clinical Social Work's list of facilities offering this level of care within the geographic area requested by the patient (or if unable, by the patient's family).  11/01/2012  Patient/family informed of their freedom to choose among providers that offer the needed level of care, that participate in Medicare, Medicaid or managed care program needed by the patient, have an available bed and are willing to accept the patient.  11/01/2012  Patient/family informed of [facility]' ownership interest in [facility], as well as of the fact that they are under no obligation to receive care at this facility.  PASARR submitted to EDS on 11/02/2012 PASARR number received from EDS on 11/02/2012  FL2 transmitted to all facilities in geographic area requested by pt/family on  11/02/2012 FL2 transmitted to all facilities within larger geographic area on   Patient informed that his/her managed care company has contracts with or will negotiate with  certain facilities, including the following:     Patient/family informed of bed offers received:   Patient chooses bed at  Physician recommends and patient chooses bed at    Patient to be transferred to  on   Patient to be transferred to facility by   The following physician request were entered in Epic:   Additional Comments:  Dede Query, MSW, Theresia Majors [phone]

## 2012-11-02 NOTE — Progress Notes (Signed)
Bladder scan at 0630 revealed urine. Patient with no urge to void at this time. Fluids encouraged and day RN notified.

## 2012-11-03 LAB — BASIC METABOLIC PANEL
CO2: 25 mEq/L (ref 19–32)
Calcium: 9.1 mg/dL (ref 8.4–10.5)
Chloride: 103 mEq/L (ref 96–112)
Creatinine, Ser: 0.66 mg/dL (ref 0.50–1.10)
Glucose, Bld: 124 mg/dL — ABNORMAL HIGH (ref 70–99)

## 2012-11-03 LAB — URINALYSIS, ROUTINE W REFLEX MICROSCOPIC
Glucose, UA: NEGATIVE mg/dL
Ketones, ur: NEGATIVE mg/dL
pH: 7 (ref 5.0–8.0)

## 2012-11-03 LAB — URINE MICROSCOPIC-ADD ON

## 2012-11-03 MED ORDER — GABAPENTIN 100 MG PO CAPS
200.0000 mg | ORAL_CAPSULE | Freq: Three times a day (TID) | ORAL | Status: DC
  Administered 2012-11-03 – 2012-11-05 (×6): 200 mg via ORAL
  Filled 2012-11-03 (×9): qty 2

## 2012-11-03 NOTE — Consult Note (Addendum)
Urology Consult   Physician requesting consult: [place]  Reason for consult: Urinary retention  History of Present Illness: Brenda Haas is a 62 y.o. female with PMH significant for PSVT, IBS with spastic colon, thyroid disease, and chronic low back pain who is s/p lumbar surgery 10/26/12 by Dr. Jeral Fruit.  Her foley was removed post op but she was unable to void.  She was started on flomax and this has been continued. She has had several I/O caths and still has not been able to void.  An indwelling foley was replaced last night.  Pt is having mobility issues and is working with rehab services.  She also has chronic constipation which of course has continued to be an issues post op as well.    She states that for several years prior to her back surgery she had urinary hesitancy issues which she noted to be worse when she was taking narcotic pain medication.  She has had nocturia x 2-3 for several years as well.  She also notes a spraying stream most of the time.  She has rare SUI with minimal leakage due to sneezing.  She denies a history of hematuria, UTIs, STDs, urolithiasis, GU malignancy/trauma/surgery.  She is currently resting albeit uncomfortably.  She denies dizziness, F/C, CP, SOB, N/V, and abd pain.   Past Medical History  Diagnosis Date  . Hyperlipidemia     borderline  . Supraventricular tachycardia, paroxysmal   . Irritable bowel syndrome (IBS)   . Spastic colon   . Insomnia   . Hypercalcemia   . Multiple lung nodules     CT 2009  . Cancer     h/o squamous cell carcinoma  . Thyroid disease     goiter and right thyroid nodule  . Chronic headaches     controlled w/ fioricet  . Allergy   . Chronic low back pain   . Chronic pain disorder   . Complication of anesthesia     difficulty going to sleep  . PONV (postoperative nausea and vomiting)   . Family history of anesthesia complication     Son- difficulty waking up  . Constipation   . GERD (gastroesophageal reflux  disease)     did take Nexium , not needed lately  . Hemorrhoids   urinary hesitancy Mild SUI  Past Surgical History  Procedure Laterality Date  . Knee arthroscopy    . Dilation and curettage of uterus    . Colonscopy    . Tonsillectomy    . Posterior lumbar fusion 4 level N/A 10/26/2012    Procedure: POSTERIOR LUMBAR FUSION 4 LEVEL;  Surgeon: Karn Cassis, MD;  Location: [facility];  Service: Neurosurgery;  Laterality: N/A;  Lumbar two-three, lumbar three-four,lumbar four-five,Lumbar five-Sacral oneDiskectomy/Fusion/Cages/Pedicle screws/Posterolateral arthrodesis/Cellsaver C-Arm  c section  HOME MEDS:  Prior to Admission medications   Medication Sig Start Date End Date Taking? Authorizing Zaim Nitta  BIOTIN PO Take 10,000 mcg by mouth daily.    Yes Historical Mariame Rybolt, MD  Calcium Carbonate (CALTRATE 600 PO) Take 600 mg by mouth daily. 1500 mg 1 qd w/ food   Yes Historical Alice Burnside, MD  clobetasol cream (TEMOVATE) 0.05 % Apply 1 application topically daily as needed.    Yes Historical Ommie Degeorge, MD  diazepam (VALIUM) 5 MG tablet Take 5 mg by mouth at bedtime.    Yes Historical Meghann Landing, MD  gabapentin (NEURONTIN) 800 MG tablet Take 800 mg by mouth at bedtime.    Yes Historical Kaizen Ibsen, MD  glucosamine-chondroitin  500-400 MG tablet Take 1 tablet by mouth daily.   Yes Historical Kuba Shepherd, MD  HYDROcodone-acetaminophen (LORTAB) 7.5-500 MG per tablet Take 1 tablet by mouth 2 (two) times daily as needed for pain.    Yes Historical Marita Burnsed, MD  ibuprofen (ADVIL,MOTRIN) 800 MG tablet Take 800 mg by mouth 2 (two) times daily as needed for pain.    Yes Historical Deaaron Fulghum, MD  lidocaine (LIDODERM) 5 % Place 1 patch onto the skin daily as needed (for pain). Remove & Discard patch within 12 hours or as directed by MD   Yes Historical Aeris Hersman, MD  Multiple Vitamin (MULITIVITAMIN WITH MINERALS) TABS Take 1 tablet by mouth daily.     Yes Historical Tate Zagal, MD  Omega-3 Fatty Acids (OMEGA 3 PO)  Take 1 capsule by mouth daily.   Yes Historical Sheryle Vice, MD  Safflower Oil (CLA) 1000 MG CAPS Take 1,000 mg by mouth daily.   Yes Historical Starlynn Klinkner, MD  senna (SENOKOT) 8.6 MG TABS Take 2 tablets by mouth 3 (three) times daily.   Yes Historical Gahel Safley, MD  traZODone (DESYREL) 100 MG tablet Take 100 mg by mouth at bedtime.   Yes Historical Lashawnta Burgert, MD    Current Hospital Medications: Scheduled Meds: . gabapentin  200 mg Oral TID  . heparin subcutaneous  5,000 Units Subcutaneous Q8H  . mupirocin ointment   Nasal BID  . sodium chloride  3 mL Intravenous Q12H  . tamsulosin  0.8 mg Oral Daily  . traZODone  100 mg Oral QHS  . vancomycin  125 mg Oral Q6H   Continuous Infusions: . sodium chloride    . sodium chloride 100 mL/hr at 10/29/12 0507  . sodium chloride 125 mL/hr at 11/03/12 0016   PRN Meds:.acetaminophen, acetaminophen, diazepam, docusate sodium, ibuprofen, magic mouthwash, menthol-cetylpyridinium, morphine injection, ondansetron (ZOFRAN) IV, ondansetron (ZOFRAN) IV, oxyCODONE-acetaminophen, phenol, senna, sodium chloride, sodium phosphate, zolpidem  Allergies:  Allergies  Allergen Reactions  . Penicillins Swelling    Family History  Problem Relation Age of Onset  . Cancer Mother     Bladder  . Heart attack Father   . Cancer Maternal Aunt     Lung  . Cancer Maternal Grandfather     Prostate    Social History:  reports that she quit smoking about 8 years ago. She has never used smokeless tobacco. She reports that  drinks alcohol. She reports that she does not use illicit drugs.  ROS: A complete review of systems was performed.  All systems are negative except for pertinent findings as noted.  Physical Exam:  Vital signs in last 24 hours: Temp:  [97.6 F (36.4 C)-98.2 F (36.8 C)] 98 F (36.7 C) (03/06 0600) Pulse Rate:  [65-82] 67 (03/06 0600) Resp:  [18] 18 (03/06 0600) BP: (140-158)/(51-71) 147/58 mmHg (03/06 0600) SpO2:  [98 %-99 %] 98 % (03/06  0600) General:  Alert and oriented, No acute distress HEENT: Normocephalic, atraumatic Neck: No JVD or lymphadenopathy Cardiovascular: Regular rate and rhythm Lungs: Clear bilaterally Abdomen: Soft, nontender, nondistended, no abdominal masses Foley in place with yellow urine in bag Extremities: No edema Neurologic: Grossly intact  Laboratory Data:  Lab Results  Component Value Date   HGB 9.5* 10/26/2012   HCT 28.0* 10/26/2012      No results found for this or any previous visit (from the past 24 hour(s)). Recent Results (from the past 240 hour(s))  SURGICAL PCR SCREEN     Status: None   Collection Time    10/26/12  9:14  AM      Result Value Range Status   MRSA, PCR NEGATIVE  NEGATIVE Final   Staphylococcus aureus NEGATIVE  NEGATIVE Final   Comment:            The Xpert SA Assay (FDA     approved for NASAL specimens     in patients over 2 years of age),     is one component of     a comprehensive surveillance     program.  Test performance has     been validated by [employer] for patients greater     than or equal to 79 year old.     It is not intended     to diagnose infection nor to     guide or monitor treatment.    Renal Function: No results found for this basename: CREATININE,  in the last 168 hours CrCl is unknown because no creatinine reading has been taken.  Radiologic Imaging: Mr Lumbar Spine W Wo Contrast  11/02/2012  *RADIOLOGY REPORT*  Clinical Data: Back surgery 1 week ago with difficulty ambulating and constipation.  MRI LUMBAR SPINE WITHOUT AND WITH CONTRAST  Technique:  Multiplanar and multiecho pulse sequences of the lumbar spine were obtained without and with intravenous contrast.  Contrast: 19mL MULTIHANCE GADOBENATE DIMEGLUMINE 529 MG/ML IV SOLN  Comparison: Intraoperative radiographs 10/26/2012.  Preoperative MRI 12/11/2011 and myelogram CT 09/02/2012.  Findings: There are postsurgical changes status post recent laminectomy with posterior and  interbody fusion from L2-S1. The hardware appears unchanged from the intraoperative radiographs. The alignment is stable.  There is no evidence of vertebral body endplate destruction or suspicious marrow enhancement.  There is no evidence of osseous lucency surrounding the hardware.  There are peripherally enhancing fluid collections around the interconnecting rods, most prominent on the left at L3.  This fluid appears contiguous in the midline posterior to the thecal sac.  No abnormal intradural enhancement is seen.  There is no anterior paraspinal inflammatory change or fluid collection.  There are no significant disc space findings from T11-T12 through L1-L2.  L2-L3:  Postsurgical changes with asymmetric epidural fluid laterally on the left. Mild residual inferior foraminal narrowing on the left.  L3-L4:  The fluid within the laminectomy bed exerts mild mass effect on the thecal sac and extends laterally on the right.  There is no osseous foraminal stenosis.  L4-L5: There is mild anterior extension of the fluid from the laminectomy bed along both sides of the thecal sac.  There is no osseous foraminal stenosis.  L5-S1:  There is no significant paraspinal fluid at this level.  A small residual central disc protrusion and asymmetric osteophytes are noted on the right.  IMPRESSION:  1.  Nonspecific posterior paraspinal fluid collection status post L2-S1 laminectomy and fusion.  The fluid collections exert mild mass effect on the thecal sac and extend asymmetrically towards the left.  There is no evidence of acute paraspinal hematoma. These collections are nonspecific and not unexpected 1 week following surgery.  However, a CSF leak cannot be completely excluded. Correlate clinically. 2.  No specific signs of infection are identified.  However, if that is a concern, close follow-up would be warranted. 3.  No significant residual spinal stenosis or nerve root encroachment.   Original Report Authenticated By: Carey Bullocks, M.D.     Impression/Assessment:  Urinary retention in pt who is s/p lumbar surgery with a hx of urinary hesitancy and constipation  Plan:  Check UA to r/o infection  Check BMP in pt who has been in retention and received IV contrast recently  Aggressive treatment of constipation as this can contribute to retention Continue flomax Leave foley in place for approx 7 days after which a void trial should be repeated.  This can be done as an inpt or in our office as an outpt.  If the pt is discharged prior to void trial she will need to call our office for an appt to f/u with Dr. Brunilda Payor for trial.   Silas Flood. 11/03/2012, 1:00 PM   I agree with the patient evaluation and treatment plan.  CC: Dr Hilda Lias

## 2012-11-03 NOTE — Progress Notes (Signed)
INITIAL NUTRITION ASSESSMENT  DOCUMENTATION CODES Per approved criteria  -Not Applicable   INTERVENTION: Offer snacks BID (magic cup and yogurt)  NUTRITION DIAGNOSIS: Inadequate oral intake related to decreased appetite 2/2 pain as evidenced by 5% meal completion .   Goal: Pt to meet >/= 90% of estimated needs  Monitor:  Weight trends, po intake   Reason for Assessment: Malnutrition Screening Tool (MST=2)  62 y.o. female  Admitting Dx: Lumbar Pain  ASSESSMENT: Pt is 62 yo female post Lumbar fusion surgery 10/26/12. Dietetic Intern spoke with pt regarding nutrition status. Pt states her weight has fluctuated due to her surgery.  Before her illness and surgery, her weight was lower (around 160 lbs) but reports weighing as much as 200 lbs at one point. Pt feels she has lost weight recently.  When visiting, pt in pain and had trouble with specific time frame of weight loss and gain. Currently weight in chart is 189 lbs. Pt does not feel concerned about any weight loss at this point. Pt states she has not appetite and does not feel like eating. Pt feels her lack of appetite is due to the pain. She also has sores in her mouth that have made it difficult.  Despite her decreased intake, pt said she did not want any nutrition supplements.  Pt did have yogurt and felt like she would prefer snacks over nutrition supplements.   Pt plans to d/c to SNF. Pt discussed during rounds and with RN.     Height: Ht Readings from Last 1 Encounters:  10/26/12 5\' 8"  (1.727 m)    Weight: Wt Readings from Last 1 Encounters:  10/26/12 189 lb 3.2 oz (85.821 kg)    Ideal Body Weight: 140 lbs   % Ideal Body Weight: 135%  Wt Readings from Last 10 Encounters:  10/26/12 189 lb 3.2 oz (85.821 kg)  10/26/12 189 lb 3.2 oz (85.821 kg)  10/20/12 189 lb 3.2 oz (85.821 kg)    Usual Body Weight: ~180 lbs   % Usual Body Weight: 105%  BMI:  Body mass index is 28.77 kg/(m^2). Overweight   Estimated  Nutritional Needs: Kcal: 1700-1900 Protein: 75-90 g  Fluid: 1.7-1.9  L  Skin: incision- neck, back  Diet Order: General  EDUCATION NEEDS: -No education needs identified at this time   Intake/Output Summary (Last 24 hours) at 11/03/12 0954 Last data filed at 11/03/12 0929  Gross per 24 hour  Intake 1965.84 ml  Output   1800 ml  Net 165.84 ml    Last BM: 11/02/2012   Labs:  No results found for this basename: NA, K, CL, CO2, BUN, CREATININE, CALCIUM, MG, PHOS, GLUCOSE,  in the last 168 hours  CBG (last 3)  No results found for this basename: GLUCAP,  in the last 72 hours  Scheduled Meds: . gabapentin  200 mg Oral TID  . heparin subcutaneous  5,000 Units Subcutaneous Q8H  . mupirocin ointment   Nasal BID  . sodium chloride  3 mL Intravenous Q12H  . tamsulosin  0.8 mg Oral Daily  . traZODone  100 mg Oral QHS  . vancomycin  125 mg Oral Q6H    Continuous Infusions: . sodium chloride    . sodium chloride 100 mL/hr at 10/29/12 0507  . sodium chloride 125 mL/hr at 11/03/12 0016    Past Medical History  Diagnosis Date  . Hyperlipidemia     borderline  . Supraventricular tachycardia, paroxysmal   . Irritable bowel syndrome (IBS)   .  Spastic colon   . Insomnia   . Hypercalcemia   . Multiple lung nodules     CT 2009  . Cancer     h/o squamous cell carcinoma  . Thyroid disease     goiter and right thyroid nodule  . Chronic headaches     controlled w/ fioricet  . Allergy   . Chronic low back pain   . Chronic pain disorder   . Complication of anesthesia     difficulty going to sleep  . PONV (postoperative nausea and vomiting)   . Family history of anesthesia complication     Son- difficulty waking up  . Constipation   . GERD (gastroesophageal reflux disease)     did take Nexium , not needed lately  . Hemorrhoids     Past Surgical History  Procedure Laterality Date  . Knee arthroscopy    . Dilation and curettage of uterus    . Colonscopy    .  Tonsillectomy    . Posterior lumbar fusion 4 level N/A 10/26/2012    Procedure: POSTERIOR LUMBAR FUSION 4 LEVEL;  Surgeon: Karn Cassis, MD;  Location: [facility];  Service: Neurosurgery;  Laterality: N/A;  Lumbar two-three, lumbar three-four,lumbar four-five,Lumbar five-Sacral oneDiskectomy/Fusion/Cages/Pedicle screws/Posterolateral arthrodesis/Cellsaver C-Arm    Belenda Cruise  Dietetic Intern Pager: [phone]  Intern note/chart reviewed. Kendell Bane RD, LDN, CNSC [phone] Pager [phone] After Hours Pager

## 2012-11-03 NOTE — Progress Notes (Signed)
Patient ID: Brenda Haas, female   DOB: 11-Apr-1951, 63 y.o.   MRN: [id number] Showed the mri to dr Hall(RADIOLOGY) TO SEE IF DRAINAGE of the epidural fluid collection is an option but he does not believe it will do any good. He sees this quite often postop. Today i was able to talk  To Brenda Haas andher husband. i showed them the mri and my opinion and the advise given to me. Will continue pt/ot. Waiting for a SNF. Will ge GU consult. Her mouth blisterns are better.beuro unchanged

## 2012-11-03 NOTE — Clinical Social Work Note (Signed)
Clinical Social Work   CSW met with pt to provide bed offers. CSW explained that she would need to choose a facility and then the prior auth would be able to begin. CSW encouraged pt to have a backup discharge plan, in the event that insurance were to deny SNF stay. CSW will follow up with bed choice. CSw will continue to follow for discharge planning needs.   Dede Query, MSW, Theresia Majors [phone]

## 2012-11-03 NOTE — Progress Notes (Signed)
PT Progress Note   11/03/12 1031  PT Visit Information  Last PT Received On 11/03/12  Assistance Needed +2  PT Time Calculation  PT Start Time 1000  PT Stop Time 1023  PT Time Calculation (min) 23 min  Precautions  Precautions Back;Fall  Precaution Comments (Pt verbalized 3/3 back precautions, cues for bed mobility.)  Required Braces or Orthoses Spinal Brace  Spinal Brace Lumbar corset;Applied in sitting position  Restrictions  Weight Bearing Restrictions No  Cognition  Overall Cognitive Status Appears within functional limits for tasks assessed/performed  Arousal/Alertness Awake/alert  Orientation Level Appears intact for tasks assessed  Behavior During Session [facility] for tasks performed  Cognition - Other Comments Pt initially did not want PT today but with encouragement completed session.  Bed Mobility  Bed Mobility Rolling Right;Right Sidelying to Sit;Sitting - Scoot to [place] of Bed;Sit to Sidelying Right  Rolling Right 4: Min assist;Not tested (comment)  Right Sidelying to Sit 4: Min assist;With rails;HOB flat  Sitting - Scoot to Edge of Bed 4: Min guard  Sit to Sidelying Right 3: Mod assist;With rail;HOB flat  Details for Bed Mobility Assistance Cues for hand placement & maintaining back precautions during log roll & sidelying<>sit.    Transfers  Transfers Sit to Stand;Stand to Sit  Sit to Stand 4: Min assist;From elevated surface;With upper extremity assist;From bed;1: +2 Total assist  Stand to Sit 3: Mod assist;To bed;With upper extremity assist;To elevated surface  Details for Transfer Assistance Min (A) sit to stand from elevated bed + pt starting with UE's on RW for support.  Ambulation/Gait  Ambulation/Gait Assistance 4: Min assist  Ambulation Distance (Feet) 80 Feet  Assistive device Rolling walker  Ambulation/Gait Assistance Details Cues to stay within walker & to relax upper body.  Gait Pattern Step-through pattern;Decreased step length - right;Decreased step length  - left;Decreased stride length;Decreased hip/knee flexion - right;Decreased hip/knee flexion - left;Trunk flexed  Gait velocity decreased  General Gait Details Pt able to correct upright posture with cues but would revert to flexed position.  Stairs No  Engineering geologist No  PT - End of Session  Equipment Utilized During Treatment Gait belt;Back brace  Activity Tolerance Patient tolerated treatment well  Patient left in bed;with call bell/phone within reach  Nurse Communication Mobility status  PT - Assessment/Plan  Comments on Treatment Session Pt had decreased tolerance for activity this session.  Pt stated she had not slept well due to discomfort.  PT Plan Discharge plan remains appropriate  PT Frequency Min 5X/week  Follow Up Recommendations SNF;Home health PT;Supervision/Assistance - 24 hour  PT equipment Rolling walker with 5" wheels  Acute Rehab PT Goals  Time For Goal Achievement 11/03/12  Potential to Achieve Goals Good  Pt will Roll Supine to Right Side with modified independence  PT Goal: Rolling Supine to Right Side - Progress Progressing toward goal  Pt will Roll Supine to Left Side with modified independence  Pt will go Supine/Side to Sit with supervision  PT Goal: Supine/Side to Sit - Progress Progressing toward goal  Pt will Sit at [facility] of Bed with modified independence;1-2 min  Pt will go Sit to Supine/Side with supervision  PT Goal: Sit to Supine/Side - Progress Progressing toward goal  Pt will go Sit to Stand with supervision;with upper extremity assist  PT Goal: Sit to Stand - Progress Progressing toward goal  Pt will go Stand to Sit with supervision  PT Goal: Stand to Sit - Progress Progressing toward goal  Pt will Ambulate 51 - 150 feet;with supervision;with least restrictive assistive device  PT Goal: Ambulate - Progress Progressing toward goal  Additional Goals  Additional Goal #1 Pt. will state and adhere to 3/3 back precautions and  log rolling technique  PT Goal: Additional Goal #1 - Progress Progressing toward goal  PT General Charges  $$ ACUTE PT VISIT 1 Procedure  PT Treatments  $Gait Training 8-22 mins  $Therapeutic Activity 8-22 mins    Sonnie Alamo, SPTA 11/03/12

## 2012-11-04 NOTE — Progress Notes (Signed)
Physical Therapy Treatment Patient Details Name: Brenda Haas MRN: [id number] DOB: 1951/02/05 Today's Date: 11/04/2012 Time: 4098-1191 PT Time Calculation (min): 30 min  PT Assessment / Plan / Recommendation Comments on Treatment Session  Pt states she does not want to go to SNF at d/c & that she will have 24 hr (A) at home.  Pt cont's to present with LE weakness & has diffculty achieving standing up however allowed pt to modify technique to increase ease-- allowed pt to begin with hands on RW & pull herself to standing while RW was stabilized by PTA.  Pt able to perform with decreased (A).   If pt goes directly home she will need 24 hr (A) + HHPT.       Follow Up Recommendations  Home health PT;SNF;Supervision/Assistance - 24 hour     Does the patient have the potential to tolerate intense rehabilitation     Barriers to Discharge        Equipment Recommendations  Rolling walker with 5" wheels    Recommendations for Other Services    Frequency Min 5X/week   Plan Discharge plan remains appropriate    Precautions / Restrictions Precautions Precautions: Back;Fall Precaution Comments: Pt cont's to require occassional cueing to reinforce back precautions with functional mobility.   Required Braces or Orthoses: Spinal Brace Spinal Brace: Lumbar corset;Applied in sitting position Restrictions Weight Bearing Restrictions: No       Mobility  Bed Mobility Bed Mobility: Rolling Right;Right Sidelying to Sit;Sitting - Scoot to Edge of Bed;Sit to Sidelying Right Rolling Right: 4: Min guard Right Sidelying to Sit: 4: Min guard;HOB flat;With rails Sitting - Scoot to Edge of Bed: 5: Supervision Sit to Sidelying Right: 4: Min assist;HOB flat Details for Bed Mobility Assistance: Cues to reinforce back precautions.  Still appears effortful but able to achieve supine>sit without physical (A); Min (A) to lift LE's back into bed.   Transfers Transfers: Sit to Stand;Stand to [employer]  Transfers Sit to Stand: 4: Min guard;4: Min assist;With upper extremity assist;From bed;From chair/3-in-1 Stand to Sit: 4: Min guard;With upper extremity assist;With armrests;To bed;To chair/3-in-1 Stand Pivot Transfers: 4: Min guard Details for Transfer Assistance: Pt cont's to have difficulty achieving standing when pushing from seated surface therefore allowed pt to begin with bil UE's placed on RW & pull herself up while this clinician stabilized AD.  Pt able to achieve standing with Min Guard from elevated bed & Min (A) from recliner (lower surface).   Spoke with pt about elevating seated surfaces at home with pillows or blankets.     Ambulation/Gait Ambulation/Gait Assistance: 4: Min guard Ambulation Distance (Feet): 150 Feet Assistive device: Rolling walker Ambulation/Gait Assistance Details: Pt cont's to rely heaviliy on RW for support-- Encouraged increased WBing through LE's.  Cues for increased upright posture.   Gait Pattern: Step-through pattern;Decreased stride length (decreased step height) General Gait Details: Pt reports burning in upper thighs Stairs: No Wheelchair Mobility Wheelchair Mobility: No      PT Goals Acute Rehab PT Goals Time For Goal Achievement: 11/03/12 Potential to Achieve Goals: Good Pt will Roll Supine to Right Side: with modified independence PT Goal: Rolling Supine to Right Side - Progress: Progressing toward goal Pt will Roll Supine to Left Side: with modified independence Pt will go Supine/Side to Sit: with supervision PT Goal: Supine/Side to Sit - Progress: Progressing toward goal Pt will Sit at Edge of Bed: with modified independence;1-2 min Pt will go Sit to Supine/Side: with supervision PT Goal:  Sit to Supine/Side - Progress: Progressing toward goal Pt will go Sit to Stand: with supervision;with upper extremity assist PT Goal: Sit to Stand - Progress: Progressing toward goal Pt will go Stand to Sit: with supervision PT Goal: Stand to Sit -  Progress: Progressing toward goal Pt will Ambulate: 51 - 150 feet;with supervision;with least restrictive assistive device PT Goal: Ambulate - Progress: Progressing toward goal Additional Goals Additional Goal #1: Pt. will state and adhere to 3/3 back precautions and log rolling technique PT Goal: Additional Goal #1 - Progress: Progressing toward goal  Visit Information  Last PT Received On: 11/04/12 Assistance Needed: +1    Subjective Data      Cognition  Cognition Overall Cognitive Status: Appears within functional limits for tasks assessed/performed Arousal/Alertness: Awake/alert Orientation Level: Appears intact for tasks assessed Behavior During Session: [facility] for tasks performed    Balance     End of Session PT - End of Session Equipment Utilized During Treatment: Gait belt;Back brace Activity Tolerance: Patient tolerated treatment well Patient left: in bed;with call bell/phone within reach Nurse Communication: Mobility status    Verdell Face, [place] [phone] 11/04/2012

## 2012-11-04 NOTE — Progress Notes (Signed)
1330 Received a call from the NT for assistance in room 4N05.  I went to room she was assisting patient to the floor.  I assisted her to lower the patient to her knees then with a two person assist we assisted her to the chair.  Patient was ambulating from the bathroom to the chair with the assistance of the NT when her legs became weak and she was assisted to the floor.  No injury to patient, Vital signs stable.  Dr. Jeral Fruit was on the unit and assessed patient, no new orders given.  Patient's husband Brett Canales was notified.  Will continue to monitor. A. Day, RN

## 2012-11-04 NOTE — Progress Notes (Signed)
Patient ID: Brenda Haas, female   DOB: 07-02-1951, 62 y.o.   MRN: [id number] Spoke with Ms Clodfelter. She is moving. her legs better , able to feel the need to have a bm BUT still sensory radiculopathy at l2-3.he is afraid to go to A SNF. Agrees to have the mri and ct lumbar. i will see her tomorrow saturday

## 2012-11-04 NOTE — Progress Notes (Signed)
Brenda Haas, [place] [phone] 11/04/2012

## 2012-11-04 NOTE — Progress Notes (Addendum)
Patient ID: Brenda Haas, female   DOB: 14-Dec-1950, 62 y.o.   MRN: [id number] Patient was able to ambulate with help and stan in the bathroom by herself according with my talk with her nurse. Had a bm . Based on pt notes she is slowy progressing. We are looking for a SNF. stil i can not explain her clinical situation based in the mri. i showed the postop lumbar mri to dr Ricka Burdock  And we can not find any conus medullaris changes and the epidural collection is not unusual after her fusion. He suggested a mri of the thoracic spine to r/o a herniated disc and a ct of the lumbar area to look at the position of the screws. i will talk to the patient about it

## 2012-11-05 ENCOUNTER — Inpatient Hospital Stay (HOSPITAL_COMMUNITY): Payer: BC Managed Care – PPO

## 2012-11-05 LAB — URINE CULTURE: Colony Count: >=100000

## 2012-11-05 MED ORDER — GABAPENTIN 300 MG PO CAPS
300.0000 mg | ORAL_CAPSULE | Freq: Three times a day (TID) | ORAL | Status: DC
  Administered 2012-11-05 – 2012-11-10 (×15): 300 mg via ORAL
  Filled 2012-11-05 (×17): qty 1

## 2012-11-05 NOTE — Progress Notes (Signed)
Patient ID: Brenda Haas, female   DOB: 01-Apr-1951, 62 y.o.   MRN: [id number] Stable. Spoke with her and husband. Show them the thoracic mri which was normal as well as the location of the pedicles screws in the lumbar ct. She wants to go home but i advised her about SNF for continuation of her rehabilitation. Aware that there is no need for further surgery. To increase the gabapentin

## 2012-11-06 MED ORDER — CIPROFLOXACIN HCL 500 MG PO TABS
500.0000 mg | ORAL_TABLET | Freq: Two times a day (BID) | ORAL | Status: DC
  Administered 2012-11-06 – 2012-11-10 (×9): 500 mg via ORAL
  Filled 2012-11-06 (×12): qty 1

## 2012-11-06 NOTE — Progress Notes (Signed)
Physical Therapy Treatment Patient Details Name: Brenda Haas MRN: [id number] DOB: 23-Dec-1950 Today's Date: 11/06/2012 Time: 7829-5621 PT Time Calculation (min): 23 min  PT Assessment / Plan / Recommendation Comments on Treatment Session  Pt not demonstrating consistency with progression of mobility at this date.  Mobility appears effortful.      Follow Up Recommendations  Home health PT;Supervision/Assistance - 24 hour;SNF     Does the patient have the potential to tolerate intense rehabilitation     Barriers to Discharge        Equipment Recommendations  Rolling walker with 5" wheels    Recommendations for Other Services    Frequency Min 5X/week         Precautions / Restrictions Precautions Precautions: Back;Fall Precaution Comments: Pt verbalizes 3/3 back precautions but requires cueing during mobility.   Required Braces or Orthoses: Spinal Brace Spinal Brace: Lumbar corset;Applied in sitting position       Mobility  Bed Mobility Bed Mobility: Rolling Right;Right Sidelying to Sit;Sitting - Scoot to [place] of Bed Rolling Right: 4: Min assist Right Sidelying to Sit: 4: Min guard;HOB flat Sitting - Scoot to Edge of Bed: 6: Modified independent (Device/Increase time) Details for Bed Mobility Assistance: Transitional movements appeared more effortful today (pt now in air over-lay bed) Transfers Transfers: Sit to Stand;Stand to Sit;Stand Pivot Transfers Sit to Stand: 4: Min assist;With upper extremity assist;From bed;From chair/3-in-1;With armrests Stand to Sit: 4: Min guard;With upper extremity assist;With armrests;To chair/3-in-1 Details for Transfer Assistance: Pt still unable to achieve standing by starting with hands from seated surface.  Increased (A) to achieve standing today compared to last PT session.  Pt c/o of Lt LE numbness "I just can't move it" & c/o Rt foot pain.   Ambulation/Gait Ambulation/Gait Assistance: 4: Min guard Ambulation Distance (Feet): 30  Feet Assistive device: Rolling walker Ambulation/Gait Assistance Details: Distance limited today due to pt c/o Rt foot pain & Lt LE weakness.    Gait Pattern: Step-through pattern;Decreased stride length;Decreased weight shift to left;Decreased weight shift to right (decreased step height) Gait velocity: decreased Stairs: No Wheelchair Mobility Wheelchair Mobility: No     PT Goals Acute Rehab PT Goals Time For Goal Achievement: 11/03/12 Potential to Achieve Goals: Good Pt will Roll Supine to Right Side: with modified independence PT Goal: Rolling Supine to Right Side - Progress: Not met Pt will Roll Supine to Left Side: with modified independence Pt will go Supine/Side to Sit: with supervision PT Goal: Supine/Side to Sit - Progress: Not met Pt will Sit at [facility] of Bed: with modified independence;1-2 min Pt will go Sit to Supine/Side: with supervision Pt will go Sit to Stand: with supervision;with upper extremity assist PT Goal: Sit to Stand - Progress: Not met Pt will go Stand to Sit: with supervision PT Goal: Stand to Sit - Progress: Not met Pt will Ambulate: 51 - 150 feet;with supervision;with least restrictive assistive device PT Goal: Ambulate - Progress: Not met Additional Goals Additional Goal #1: Pt. will state and adhere to 3/3 back precautions and log rolling technique PT Goal: Additional Goal #1 - Progress: Not met  Visit Information  Last PT Received On: 11/06/12 Assistance Needed: +1    Subjective Data      Cognition  Cognition Overall Cognitive Status: Appears within functional limits for tasks assessed/performed Arousal/Alertness: Awake/alert Orientation Level: Appears intact for tasks assessed Behavior During Session: [facility] for tasks performed    Balance     End of Session PT - End of  Session Equipment Utilized During Treatment: Gait belt;Back brace Activity Tolerance: Patient limited by pain Patient left: in chair;with call bell/phone within reach Nurse  Communication: Mobility status     Verdell Face, [place] [phone] 11/06/2012

## 2012-11-06 NOTE — Progress Notes (Signed)
Subjective: Patient reports thought she was going for pedicure this morning.  Objective: Vital signs in last 24 hours: Temp:  [97.7 F (36.5 C)-98.5 F (36.9 C)] 97.7 F (36.5 C) (03/09 0200) Pulse Rate:  [67-79] 71 (03/09 0200) Resp:  [18] 18 (03/09 0200) BP: (135-172)/(50-73) 144/50 mmHg (03/09 0200) SpO2:  [97 %-99 %] 97 % (03/09 0200)  Intake/Output from previous day:   Intake/Output this shift:    Physical Exam: Laying comfortably in bed.  Currently not complaining of legs burning.  Lab Results: No results found for this basename: WBC, HGB, HCT, PLT,  in the last 72 hours BMET  Recent Labs  11/03/12 1336  NA 139  K 3.2*  CL 103  CO2 25  GLUCOSE 124*  BUN 10  CREATININE 0.66  CALCIUM 9.1    Studies/Results: Ct Lumbar Spine Wo Contrast  11/05/2012  *RADIOLOGY REPORT*  Clinical Data: Recent lumbar fusion.  Bilateral leg pain and weakness.  CT LUMBAR SPINE WITHOUT CONTRAST  Technique:  Multidetector CT imaging of the lumbar spine was performed without intravenous contrast administration. Multiplanar CT image reconstructions were also generated.  Comparison: Lumbar MRI 11/02/2012  Findings: Total laminectomy L2-S1.  Bilateral pedicle screw interbody fusion L2-3, L3-4, L4-5.  Pedicle screw fusion without interbody fusion L5-S1.  The right S1 screw passes through the lateral recess.  The remaining pedicle screws are  within the pedicles.  No hardware fracture is identified. There is bone graft material around the posterior rods bilaterally.  Negative for vertebral body fracture.  No acute bony lesion.  Postoperative fluid collection posterior to the dura is present, best seen on the prior MRI.  This is not well visualized by CT.  IMPRESSION: Postsurgical fusion L2-S1.  Hardware is in good position with exception of the right S1 pedicle screw which  passes through the lateral recess but appears to be lateral to the S1 nerve root.  Negative for fracture or bony erosion.  Postop  fluid collections posterior to the dura better seen on MRI. These may be due to CSF, blood, or infection.   Original Report Authenticated By: Janeece Riggers, M.D.    Mr Thoracic Spine Wo Contrast  11/05/2012  *RADIOLOGY REPORT*  Clinical Data: Bilateral leg pain and weakness.  Recent lumbar fusion  MRI THORACIC SPINE WITHOUT CONTRAST  Technique:  Multiplanar and multiecho pulse sequences of the thoracic spine were obtained without intravenous contrast.  Comparison: CT chest 07/21/2012  Findings: Normal thoracic alignment.  Negative for fracture or mass.  No bone marrow edema is present.  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  Spinal cord has a normal appearance.  No cord edema or mass.  No cord compression.  Negative for spinal stenosis.  No disc protrusion is identified. No significant degenerative change.  IMPRESSION: Negative   Original Report Authenticated By: Janeece Riggers, M.D.     Assessment/Plan: OK to continue with PT and mobilize patient.    LOS: 11 days    Dorian Heckle, MD 11/06/2012, 5:26 AM

## 2012-11-06 NOTE — Progress Notes (Signed)
Patient IV occluded. 2 RN's attempted to restart a new site. Unsuccessful. IV team paged.

## 2012-11-06 NOTE — Progress Notes (Signed)
Pt complaining of right foot pain. No visual injuries to foot. MD notified. No orders given.

## 2012-11-07 ENCOUNTER — Encounter (INDEPENDENT_AMBULATORY_CARE_PROVIDER_SITE_OTHER): Payer: Self-pay | Admitting: General Surgery

## 2012-11-07 NOTE — Progress Notes (Signed)
Patient ID: Brenda Haas, female   DOB: 06/04/1951, 62 y.o.   MRN: [id number] Wound dry. Less pain in lower extremities with more proximal movement. Sensory nl. She agrees to go to a SNF and i did talk with the sovial worker to find one since we were unable to get the approval to go to the rehabilitation unit. Husband at home with a gi virus. sontinue with pt/ot

## 2012-11-07 NOTE — Clinical Social Work Note (Signed)
Clinical Social Work   CSW met with pt to find out choice of SNF. Pt has not made a decision. CSW and pt discussed copays and prior approval for SNF placement. Pt shared that she would rather discharge home at this point, as pt has opted to pay someone for assistance, as she does not have 24 supervision. Pt would also like home health services. CSW will update RNCM on change of discharge plan. CSW will continue to follow.   Dede Query, MSW, [facility]resia Majors [phone]

## 2012-11-07 NOTE — Progress Notes (Signed)
Patient ID: Brenda Haas, female   DOB: 02/26/1951, 62 y.o.   MRN: [id number] I spoke with her regarding the fine needle aspiration biopsy over 2 thyroid nodules. Pathology is consistent with multinodular goiter. No atypical cells were seen. I told her she does not need to have any surgery as this was an incidental finding and she really doesn't have any symptoms. I explained to her that her primary care provider could keep track of this by physical exam and a annual ultrasound. If the nodules get larger than I would be glad to see her again.

## 2012-11-08 MED ORDER — DIAZEPAM 5 MG PO TABS
5.0000 mg | ORAL_TABLET | Freq: Four times a day (QID) | ORAL | Status: DC | PRN
  Administered 2012-11-08: 5 mg via ORAL
  Filled 2012-11-08: qty 1

## 2012-11-08 NOTE — Progress Notes (Signed)
Physical Therapy Treatment Patient Details Name: Brenda Haas MRN: [id number] DOB: [birth year] Today's Date: 11/08/2012 Time: 4098-1191 PT Time Calculation (min): 23 min  PT Assessment / Plan / Recommendation Comments on Treatment Session  Pt with much improved mobility today.  No sign of lt leg weakness with gait. From PT standpoint can return home with HHPT.    Follow Up Recommendations  Home health PT;Supervision - Intermittent     Does the patient have the potential to tolerate intense rehabilitation     Barriers to Discharge        Equipment Recommendations  Rolling walker with 5" wheels;Other (comment) (3-in-1)    Recommendations for Other Services    Frequency Min 5X/week   Plan Discharge plan needs to be updated;Frequency remains appropriate    Precautions / Restrictions Precautions Precautions: Back;Fall Precaution Comments: requires verbal cuing during mobility Required Braces or Orthoses: Spinal Brace Spinal Brace: Lumbar corset;Applied in sitting position   Pertinent Vitals/Pain Pt reports pain not too bad.    Mobility  Bed Mobility Rolling Left: 5: Supervision;With rail Left Sidelying to Sit: 5: Supervision;With rails;HOB elevated Sitting - Scoot to Edge of Bed: 5: Supervision Sit to Sidelying Left: With rail;HOB flat;4: Min assist Details for Bed Mobility Assistance: Verbally reviewed with pt bed mobility without using rails and HOB flat. Assist to bring lt leg back up in bed. Transfers Sit to Stand: 5: Supervision;With upper extremity assist;From bed Stand to Sit: 5: Supervision;With upper extremity assist;To bed Details for Transfer Assistance: Verbal cues for hand placement with sit to stand. Ambulation/Gait Ambulation/Gait Assistance: 5: Supervision (Pt wanted someone following with chair in case leg got weak) Ambulation Distance (Feet): 1000 Feet Assistive device: Rolling walker Ambulation/Gait Assistance Details: Kept hand on pt due to pt's  history of inconsistent performance but pt didn't require any physical assist. Self corrected slight trunk flexion. Gait Pattern: Step-through pattern;Decreased stride length;Trunk flexed General Gait Details: No signs of lt leg weakness during gait today.    Exercises     PT Diagnosis:    PT Problem List:   PT Treatment Interventions:     PT Goals Acute Rehab PT Goals PT Goal: Rolling Supine to Left Side - Progress: Progressing toward goal PT Goal: Supine/Side to Sit - Progress: Met PT Goal: Sit at Edge Of Bed - Progress: Met PT Goal: Sit to Supine/Side - Progress: Progressing toward goal Pt will go Sit to Stand: with modified independence PT Goal: Sit to Stand - Progress: Updated due to goal met Pt will go Stand to Sit: with modified independence PT Goal: Stand to Sit - Progress: Updated due to goals met Pt will Ambulate: 51 - 150 feet;with modified independence PT Goal: Ambulate - Progress: Updated due to goal met Additional Goals PT Goal: Additional Goal #1 - Progress: Progressing toward goal  Visit Information  Last PT Received On: 11/08/12 Assistance Needed: +1    Subjective Data  Subjective: Pt reports she is feeling better.   Cognition  Cognition Overall Cognitive Status: Appears within functional limits for tasks assessed/performed Arousal/Alertness: Awake/alert Orientation Level: Appears intact for tasks assessed Behavior During Session: [facility] for tasks performed    Balance  Balance Balance Assessed: Yes Static Sitting Balance Static Sitting - Balance Support: Feet supported;No upper extremity supported Static Sitting - Level of Assistance: 6: Modified independent (Device/Increase time) Static Standing Balance Static Standing - Balance Support: Bilateral upper extremity supported (on walker) Static Standing - Level of Assistance: 5: Stand by assistance  End of Session  PT - End of Session Equipment Utilized During Treatment: Gait belt;Back brace Activity  Tolerance: Patient tolerated treatment well Patient left: in bed;with call bell/phone within reach Nurse Communication: Mobility status   GP     [facility] 11/08/2012, 2:34 PM  [employer] PT [phone]

## 2012-11-08 NOTE — Progress Notes (Signed)
   CARE MANAGEMENT NOTE 11/08/2012  Patient:  Brenda Haas, Brenda Haas   Account Number:  [id number]  Date Initiated:  10/27/2012  Documentation initiated by:  [facility]  Subjective/Objective Assessment:   admitted postop PLIF L2-S1     Action/Plan:   PT/OT evals   Anticipated DC Date:  10/30/2012   Anticipated DC Plan:  HOME W HOME HEALTH SERVICES      DC Planning Services  CM consult      [facility] Choice  HOME HEALTH   Choice offered to / List presented to:  C-1 Patient   DME arranged  3-N-1  Levan Hurst      DME agency  [facility]     HH arranged  HH-2 PT  HH-4 NURSE'S AIDE      HH agency  [facility]   Status of service:  Completed, signed off Medicare Important Message given?   (If response is "NO", the following Medicare IM given date fields will be blank) Date Medicare IM given:   Date Additional Medicare IM given:    Discharge Disposition:  HOME W HOME HEALTH SERVICES  Per UR Regulation:  Reviewed for med. necessity/level of care/duration of stay  If discussed at Long Length of Stay Meetings, dates discussed:    Comments:  11/08/2012 1500 NCM spoke to pt and provided [facility] list. Pt agreeable to [facility] for [facility]. Contacted [facility] rep for [facility]. [facility] contact info added to dc instructions. Pt is requesting RW and 3n1 for home. Notified [facility] DME rep of possible dc today or in am. Isidoro Donning RN CCM Case Mgmt phone [phone]

## 2012-11-08 NOTE — Progress Notes (Signed)
Patient ID: Brenda Haas, female   DOB: [birth year], 62 y.o.   MRN: [id number] Less pain and more mobility in lower extremities whlie she is horizontal, able to feel the urge for a bm.sensation to pin prick is normal. Less paralumbar pain. Wound dry. Foley in place. Has an appointment to see gu as an outpatient.i had along meeting with [name], her husband, social work(Sarah) and myself. Patient do not want to go to a SNF because the one she wanted to go as recommended by friends is not in her insurance list. She wants to go home and have pt/ot there but my feelings is that it will limited because of lack of portable equipment and though she is not back to normal she has improved since last week. The other option will be for her to be taken from home to the outpatient facility . At the end she wants to to reconsider the possibility of a different SNF idiopathic thrombocytopenic purpura seems she has a member of her family who will come to help her.

## 2012-11-08 NOTE — Clinical Social Work Note (Signed)
Clinical Social Work    CSW met with pt, along with MD, to address discharge plan. Pt declined SNF, in favor of discharging home with home health PT or outpatient PT. CSW updated RNCM of changed in discharge plan. CSW is signing off, as no further needs identified. Please reconsult if a need arises prior to discharge.   Dede Query, MSW, [place]ors [phone]

## 2012-11-09 NOTE — Progress Notes (Signed)
Occupational Therapy Treatment Patient Details Name: Brenda Haas MRN: [id number] DOB: Jun 27, 1951 Today's Date: 11/09/2012 Time: 0909-1000 OT Time Calculation (min): 51 min  OT Assessment / Plan / Recommendation Comments on Treatment Session Pt progressing with ADLs.  Needs min guard assist with AE.  Recommend HHOT at discharge    Follow Up Recommendations  Home health OT;Supervision/Assistance - 24 hour    Barriers to Discharge       Equipment Recommendations  3 in 1 bedside comode    Recommendations for Other Services    Frequency Min 2X/week   Plan Discharge plan needs to be updated    Precautions / Restrictions Precautions Precautions: Back;Fall Precaution Comments: Pt able to verbalize 3/3 back precautions.  Less Verbal cues to maintain during mobility. Required Braces or Orthoses: Spinal Brace Spinal Brace: Lumbar corset;Applied in sitting position Restrictions Weight Bearing Restrictions: No       ADL  Lower Body Bathing: Simulated;Min guard (with AE) Where Assessed - Lower Body Bathing: Supported sit to stand Lower Body Dressing: Min guard (with AE) Where Assessed - Lower Body Dressing: Supported sit to Pharmacist, hospital: Counsellor Method: Sit to Barista: Raised toilet seat with arms (or 3-in-1 over toilet) Toileting - Clothing Manipulation and Hygiene: Min guard Where Assessed - Engineer, mining and Hygiene: Standing Tub/Shower Transfer: Minimal assistance Tub/Shower Transfer Method: Science writer: Other (comment) (3-in-1) Equipment Used: Long-handled shoe horn;Long-handled sponge;Rolling walker;Reacher;Sock aid Transfers/Ambulation Related to ADLs: Pt ambulated with min guard assist ADL Comments: Pt instructed in use and acquisition of AE.  Pt demonstrated understanding of use.  Min cues for back precautions    OT Diagnosis:    OT Problem List:   OT  Treatment Interventions:     OT Goals ADL Goals ADL Goal: Lower Body Dressing - Progress: Met ADL Goal: Toilet Transfer - Progress: Met ADL Goal: Toileting - Clothing Manipulation - Progress: Met ADL Goal: Toileting - Hygiene - Progress: Progressing toward goals ADL Goal: Tub/Shower Transfer - Progress: Met  Visit Information  Last OT Received On: 11/09/12 Assistance Needed: +1    Subjective Data      Prior Functioning       Cognition  Cognition Overall Cognitive Status: Appears within functional limits for tasks assessed/performed Arousal/Alertness: Awake/alert Orientation Level: Appears intact for tasks assessed Behavior During Session: [facility] for tasks performed    Mobility  Bed Mobility Bed Mobility: Sit to Sidelying Right Rolling Right: 5: Supervision;With rail Right Sidelying to Sit: 5: Supervision;HOB flat;With rails Sitting - Scoot to Edge of Bed: 5: Supervision Sit to Sidelying Right: 4: Min assist;HOB flat;With rail Details for Bed Mobility Assistance: Assist to lift legs onto bed Transfers Transfers: Sit to Stand;Stand to Sit Sit to Stand: 5: Supervision;With upper extremity assist;From chair/3-in-1 Stand to Sit: 5: Supervision;With upper extremity assist;To chair/3-in-1 Details for Transfer Assistance: requires cues for proper hand placement    Exercises      Balance     End of Session OT - End of Session Equipment Utilized During Treatment: Back brace Activity Tolerance: Patient tolerated treatment well Patient left: in bed;with call bell/phone within reach Nurse Communication: Patient requests pain meds  GO     Conarpe, Wendi M 11/09/2012, 10:09 AM

## 2012-11-09 NOTE — Progress Notes (Signed)
Patient ID: Brenda Haas, female   DOB: [birth year], 62 y.o.   MRN: [id number] No pain this pm. ready to be discharge in am. My office called the GU service for an outpatient consult

## 2012-11-09 NOTE — Care Management Note (Signed)
    Page 1 of 2   11/10/2012     1:36:55 PM   CARE MANAGEMENT NOTE 11/10/2012  Patient:  Brenda Haas, Brenda Haas   Account Number:  [id number]  Date Initiated:  10/27/2012  Documentation initiated by:  [facility]  Subjective/Objective Assessment:   admitted postop PLIF L2-S1     Action/Plan:   PT/OT evals-recommending HHPT and HHOT   Anticipated DC Date:  11/10/2012   Anticipated DC Plan:  HOME W HOME HEALTH SERVICES      DC Planning Services  CM consult      [facility] Choice  HOME HEALTH   Choice offered to / List presented to:  C-1 Patient   DME arranged  3-N-1  Levan Hurst      DME agency  [facility]     HH arranged  HH-2 PT  HH-3 OT      [facility] agency  [facility]   Status of service:  Completed, signed off Medicare Important Message given?   (If response is "NO", the following Medicare IM given date fields will be blank) Date Medicare IM given:   Date Additional Medicare IM given:    Discharge Disposition:  HOME W HOME HEALTH SERVICES  Per UR Regulation:  Reviewed for med. necessity/level of care/duration of stay  If discussed at Long Length of Stay Meetings, dates discussed:    Comments:  11/10/12 OT recommending HHOT. HHOT added to home care order. Contacted De[facility] at [facility] and requested HHOT be set up along with HHPT. Patient received rolling walker and 3N1 from [facility]. Jacquelynn Cree RN, BSN, CCM  11/09/12 Per Dr. Jacky Kindle inpatient hospital stay will not be covered by insurance as of 11/10/12. Informed Brenda Haas, she is agreeable with discharge to home with HHPT, HHOT and aide on 11/10/12. Jacquelynn Cree RN, BSN, CCM   11/08/2012 1500 NCM spoke to pt and provided [facility] list. Pt agreeable to [facility] for [facility]. Contacted [facility] rep for [facility]. [facility] contact info added to dc instructions. Pt is requesting RW and 3n1 for home. Notified [facility] DME rep of possible dc today or in am. Isidoro Donning RN CCM Case Mgmt phone [phone]

## 2012-11-09 NOTE — Progress Notes (Signed)
Stable, ambulating with help. Case management note from yesterday read. Pain less and more movement of legs. Wound dry. Foley in place. No bm for 2 days.

## 2012-11-09 NOTE — Progress Notes (Signed)
Physical Therapy Treatment Patient Details Name: Brenda Haas MRN: [id number] DOB: [birth year] Today's Date: 11/09/2012 Time: 0802-0828 PT Time Calculation (min): 26 min  PT Assessment / Plan / Recommendation Comments on Treatment Session  Pt continues to improve with ambulation.  Instructed in stair/curb this session to mimic curb at home into apt. (stair goal added)  Pt confidence improved with practice.    Follow Up Recommendations  Home health PT;Supervision - Intermittent     Does the patient have the potential to tolerate intense rehabilitation     Barriers to Discharge        Equipment Recommendations  Rolling walker with 5" wheels;Other (comment)    Recommendations for Other Services    Frequency Min 5X/week   Plan Discharge plan remains appropriate    Precautions / Restrictions Precautions Precautions: Back;Fall Precaution Comments: Pt able to verbalize 3/3 back precautions.  Less Verbal cues to maintain during mobility. Required Braces or Orthoses: Spinal Brace Spinal Brace: Lumbar corset;Applied in sitting position Restrictions Weight Bearing Restrictions: No   Pertinent Vitals/Pain Pt reported she was nauseated today.     Mobility  Bed Mobility Bed Mobility: Rolling Right;Right Sidelying to Sit;Sitting - Scoot to Edge of Bed Rolling Right: 5: Supervision;With rail Right Sidelying to Sit: 5: Supervision;HOB flat;With rails Sitting - Scoot to Edge of Bed: 5: Supervision Details for Bed Mobility Assistance: Pt able to perform bed mobility with limited cues for procedure. Transfers Transfers: Sit to Stand;Stand to Sit Sit to Stand: 5: Supervision;With upper extremity assist;From bed;From chair/3-in-1 Stand to Sit: 5: Supervision;With upper extremity assist;With armrests;To chair/3-in-1 Details for Transfer Assistance: Pt able to achieve standing by placing hands on RW.  Cautioned pt on not pulling or pushing walker while standing due to safety  concerns. Ambulation/Gait Ambulation/Gait Assistance: 5: Supervision Ambulation Distance (Feet): 150 Feet Assistive device: Rolling walker Ambulation/Gait Assistance Details: Minimul cues for upright trunk and staying within RW.  Gait Pattern: Step-through pattern;Trunk flexed Gait velocity: decreased General Gait Details: Pt tends to have increase trunk flexion as fatigues. Stairs: Yes Stairs Assistance: 4: Min guard Stair Management Technique: No rails;Forwards;Step to pattern;With walker;Other (comment) (1 stair 3x's (A) with RW placement.) Number of Stairs: 1 Wheelchair Mobility Wheelchair Mobility: No    Exercises     PT Diagnosis:    PT Problem List:   PT Treatment Interventions:     PT Goals Acute Rehab PT Goals Time For Goal Achievement: 11/03/12 Potential to Achieve Goals: Good Pt will Roll Supine to Right Side: with modified independence PT Goal: Rolling Supine to Right Side - Progress: Progressing toward goal Pt will go Supine/Side to Sit: with supervision PT Goal: Supine/Side to Sit - Progress: Met Pt will go Sit to Stand: with modified independence PT Goal: Sit to Stand - Progress: Progressing toward goal Pt will go Stand to Sit: with modified independence PT Goal: Stand to Sit - Progress: Progressing toward goal Pt will Ambulate: 51 - 150 feet;with modified independence PT Goal: Ambulate - Progress: Progressing toward goal Pt will Go Up / Down Stairs: 1-2 stairs;with modified independence;with least restrictive assistive device PT Goal: Up/Down Stairs - Progress: Goal set today Additional Goals Additional Goal #1: Pt. will state and adhere to 3/3 back precautions and log rolling technique PT Goal: Additional Goal #1 - Progress: Progressing toward goal  Visit Information  Last PT Received On: 11/09/12 Assistance Needed: +1    Subjective Data  Subjective: Pt reports she not feeling as well this morning.  Has a bit  of nausea.   Cognition   Cognition Overall Cognitive Status: Appears within functional limits for tasks assessed/performed Arousal/Alertness: Awake/alert Orientation Level: Appears intact for tasks assessed Behavior During Session: [facility] for tasks performed    Balance     End of Session PT - End of Session Equipment Utilized During Treatment: Gait belt;Back brace Activity Tolerance: Patient tolerated treatment well Patient left: in chair;with call bell/phone within reach Nurse Communication: Mobility status   GP     Enid Baas, SPTA 11/09/2012, 10:07 AM   Verdell Face, PTA [phone] 11/09/2012

## 2012-11-10 NOTE — Progress Notes (Signed)
Pt left via wheelchair, accompanied by friends, and equipment, foley cath., intact.  Draining without difficulties.  Reviewed discharge instructions, and reinforced need to call urologist to schedule appointment. Evea Addis RN

## 2012-11-10 NOTE — Progress Notes (Signed)
Pt. Doing well, discharged to home, all ready set up with home PT/OT, and equipment.  RX given to patient.

## 2012-11-10 NOTE — Progress Notes (Signed)
Patient to call dr Brunilda Payor ,urologist , for a f/u appointment. She is going home taken cipro,percocet,diazepam,flomax and neurontin. Also laxatives as preop with prn fleet enema

## 2012-11-10 NOTE — Discharge Summary (Signed)
Physician Discharge Summary  Patient ID: DIETRA STOKELY MRN: [id number] DOB/AGE: 60-Jan-1952 60 y.o.  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   Discharged Condition: ambulating with help. Foley in place  Hospital Course: decompression and fusion  Consults: rehabilitation medicine. urologist  Significant Diagnostic Studies: mri lumbar. Thoracic and ct lumbar postop  Treatments: surgeruy  Discharge Exam: Blood pressure 148/62, pulse 66, temperature 98.4 F (36.9 C), temperature source Oral, resp. rate 16, height 5\' 8"  (1.727 m), weight 85.821 kg (189 lb 3.2 oz), SpO2 97.00%. Now, some proximal weakness. Sensory and dtr presenr. Foley in place. Rectal tone decrease but present  Disposition: home. She declined SNF. PATIENT TO BE SEEN BY gu as an outpatient. To continue pt/ot. To see me in 3 to 4 weeeks or before      Medication List    ASK your doctor about these medications       BIOTIN PO  Take 10,000 mcg by mouth daily.     CALTRATE 600 PO  Take 600 mg by mouth daily. 1500 mg 1 qd w/ food     CLA 1000 MG Caps  Take 1,000 mg by mouth daily.     clobetasol cream 0.05 %  Commonly known as:  TEMOVATE  Apply 1 application topically daily as needed.     diazepam 5 MG tablet  Commonly known as:  VALIUM  Take 5 mg by mouth at bedtime.     gabapentin 800 MG tablet  Commonly known as:  NEURONTIN  Take 800 mg by mouth at bedtime.     glucosamine-chondroitin 500-400 MG tablet  Take 1 tablet by mouth daily.     HYDROcodone-acetaminophen 7.5-500 MG per tablet  Commonly known as:  LORTAB  Take 1 tablet by mouth 2 (two) times daily as needed for pain.     ibuprofen 800 MG tablet  Commonly known as:  ADVIL,MOTRIN  Take 800 mg by mouth 2 (two) times daily as needed for pain.     lidocaine 5 %   Commonly known as:  LIDODERM  Place 1 patch onto the skin daily as needed (for pain). Remove & Discard patch within 12 hours or as directed by MD     multivitamin with minerals Tabs  Take 1 tablet by mouth daily.     OMEGA 3 PO  Take 1 capsule by mouth daily.     senna 8.6 MG Tabs  Commonly known as:  SENOKOT  Take 2 tablets by mouth 3 (three) times daily.     traZODone 100 MG tablet  Commonly known as:  DESYREL  Take 100 mg by mouth at bedtime.           Follow-up Information   Follow up with NESI,MARC-HENRY, MD. (call office for follow up appt)    Contact information:   [address] [place] [address] [phone]       Signed: Karn Cassis 11/10/2012, 9:36 AM

## 2012-11-10 NOTE — Progress Notes (Signed)
Patient ID: Brenda Haas, female   DOB: 1951-04-14, 62 y.o.   MRN: [id number]   No leg pain. sensory normal i did another rectal examination with the RN present. She was able to feel and the tone thought decrease is present. Perineal sensation present. She will be discharge today to be f/u by gu and me . Also to continue pt/ot

## 2012-11-10 NOTE — Progress Notes (Signed)
Physical Therapy Treatment Patient Details Name: Brenda Haas MRN: [id number] DOB: [birth year] Today's Date: 11/10/2012 Time: 6578-4696 PT Time Calculation (min): 34 min  PT Assessment / Plan / Recommendation Comments on Treatment Session  Pt reported her left knee buckled during ambulation.  Pt able to use walker to stabilize but remained anxious afterward.  Pt re-instructed in HEP for strengthening of LE's.     Follow Up Recommendations  Home health PT;Supervision - Intermittent     Does the patient have the potential to tolerate intense rehabilitation     Barriers to Discharge        Equipment Recommendations  Rolling walker with 5" wheels;Other (comment)    Recommendations for Other Services    Frequency Min 5X/week   Plan Discharge plan remains appropriate    Precautions / Restrictions Precautions Precautions: Back;Fall Precaution Comments: Pt able to verbalize 3/3 back precautions & maintain during mobility with min cues. Required Braces or Orthoses: Spinal Brace Spinal Brace: Lumbar corset;Applied in sitting position Restrictions Weight Bearing Restrictions: No   Pertinent Vitals/Pain Pt reported she was feeling well today.    Mobility  Bed Mobility Bed Mobility: Rolling Right;Right Sidelying to Sit;Sitting - Scoot to Edge of Bed;Sit to Sidelying Right Rolling Right: 5: Supervision;With rail Right Sidelying to Sit: 5: Supervision;HOB flat;With rails Sitting - Scoot to Edge of Bed: 5: Supervision Sit to Sidelying Right: 5: Supervision;With rail;HOB flat Details for Bed Mobility Assistance: Pt able to raise legs onto bed this session with increased time.  Required min cues to maintain back precautions. Transfers Transfers: Sit to Stand;Stand to Sit Sit to Stand: 5: Supervision;With upper extremity assist;From bed;With armrests;From chair/3-in-1 Stand to Sit: 5: Supervision;With upper extremity assist;To bed;To chair/3-in-1 Details for Transfer Assistance:  Sit<>Stand 2x's.  Cues to maintain back precautions. Ambulation/Gait Ambulation/Gait Assistance: 5: Supervision Ambulation Distance (Feet): 300 Feet Assistive device: Rolling walker Ambulation/Gait Assistance Details: Minimal cues for upright posture and to stay closer to RW for safety.  Pt reported that her left knee buckled. Pt able to support herself with use of walker. Gait Pattern: Step-through pattern;Trunk flexed Gait velocity: Pt able to increase gait velocity with cues. General Gait Details: Pt tends to have increase trunk flexion as fatigues. Stairs: Yes Stairs Assistance: 4: Min guard Stair Management Technique: No rails;Step to pattern;With walker;Other (comment) (Cues to step closer to edge, 1 step 3x) Number of Stairs: 1 Wheelchair Mobility Wheelchair Mobility: No    Exercises General Exercises - Lower Extremity Quad Sets: AROM;Both;10 reps;Seated Long Arc Quad: AROM;Strengthening;Both;5 reps (Pt unable to fully extend knee) Heel Slides: AROM;Strengthening;5 reps;Supine Hip ABduction/ADduction: AROM;10 reps;Seated;Strengthening Hip Flexion/Marching: AROM;Both;5 reps;Seated (Pt has difficulty, especially with Left LE)   PT Diagnosis:    PT Problem List:   PT Treatment Interventions:     PT Goals Acute Rehab PT Goals Time For Goal Achievement: 11/03/12 Potential to Achieve Goals: Good Pt will Roll Supine to Right Side: with modified independence PT Goal: Rolling Supine to Right Side - Progress: Progressing toward goal Pt will go Supine/Side to Sit: with supervision PT Goal: Supine/Side to Sit - Progress: Met Pt will go Sit to Supine/Side: with supervision PT Goal: Sit to Supine/Side - Progress: Progressing toward goal Pt will go Sit to Stand: with modified independence PT Goal: Sit to Stand - Progress: Progressing toward goal Pt will go Stand to Sit: with modified independence PT Goal: Stand to Sit - Progress: Progressing toward goal Pt will Ambulate: 51 - 150  feet;with modified independence PT Goal:  Ambulate - Progress: Progressing toward goal Pt will Go Up / Down Stairs: 1-2 stairs;with modified independence;with least restrictive assistive device PT Goal: Up/Down Stairs - Progress: Progressing toward goal Additional Goals Additional Goal #1: Pt. will state and adhere to 3/3 back precautions and log rolling technique PT Goal: Additional Goal #1 - Progress: Progressing toward goal  Visit Information  Last PT Received On: 11/10/12 Assistance Needed: +1    Subjective Data  Subjective: Pt supine in bed.  Pt reports she is feeling well this morning with no pain.   Cognition  Cognition Overall Cognitive Status: Appears within functional limits for tasks assessed/performed Arousal/Alertness: Awake/alert Orientation Level: Appears intact for tasks assessed Behavior During Session: [facility] for tasks performed    Balance     End of Session PT - End of Session Equipment Utilized During Treatment: Gait belt;Back brace Activity Tolerance: Patient tolerated treatment well Patient left: with call bell/phone within reach;in bed Nurse Communication: Mobility status   GP     Enid Baas, SPTA 11/10/2012, 9:50 AM

## 2012-11-16 NOTE — Progress Notes (Signed)
Kelly Cooper, PTA [phone] 11/16/2012  

## 2012-11-22 NOTE — Progress Notes (Signed)
Physical Therapy Treatment Patient Details Name: Brenda Haas MRN: [id number] DOB: [birth year] Today's Date: 11/09/2012 Time: 0802-0828 PT Time Calculation (min): 26 min  PT Assessment / Plan / Recommendation Comments on Treatment Session  Pt continues to improve with ambulation.  Instructed in stair/curb this session to mimic curb at home into apt. (stair goal added)  Pt confidence improved with practice.    Follow Up Recommendations  Home health PT;Supervision - Intermittent     Does the patient have the potential to tolerate intense rehabilitation     Barriers to Discharge        Equipment Recommendations  Rolling walker with 5" wheels;Other (comment)    Recommendations for Other Services    Frequency Min 5X/week   Plan Discharge plan remains appropriate    Precautions / Restrictions Precautions Precautions: Back;Fall Precaution Comments: Pt able to verbalize 3/3 back precautions.  Less Verbal cues to maintain during mobility. Required Braces or Orthoses: Spinal Brace Spinal Brace: Lumbar corset;Applied in sitting position Restrictions Weight Bearing Restrictions: No   Pertinent Vitals/Pain Pt reported she was nauseated today.     Mobility  Bed Mobility Bed Mobility: Rolling Right;Right Sidelying to Sit;Sitting - Scoot to Edge of Bed Rolling Right: 5: Supervision;With rail Right Sidelying to Sit: 5: Supervision;HOB flat;With rails Sitting - Scoot to Edge of Bed: 5: Supervision Details for Bed Mobility Assistance: Pt able to perform bed mobility with limited cues for procedure. Transfers Transfers: Sit to Stand;Stand to Sit Sit to Stand: 5: Supervision;With upper extremity assist;From bed;From chair/3-in-1 Stand to Sit: 5: Supervision;With upper extremity assist;With armrests;To chair/3-in-1 Details for Transfer Assistance: Pt able to achieve standing by placing hands on RW.  Cautioned pt on not pulling or pushing walker while standing due to safety  concerns. Ambulation/Gait Ambulation/Gait Assistance: 5: Supervision Ambulation Distance (Feet): 150 Feet Assistive device: Rolling walker Ambulation/Gait Assistance Details: Minimul cues for upright trunk and staying within RW.  Gait Pattern: Step-through pattern;Trunk flexed Gait velocity: decreased General Gait Details: Pt tends to have increase trunk flexion as fatigues. Stairs: Yes Stairs Assistance: 4: Min guard Stair Management Technique: No rails;Forwards;Step to pattern;With walker;Other (comment) (1 stair 3x's (A) with RW placement.) Number of Stairs: 1 Wheelchair Mobility Wheelchair Mobility: No    Exercises     PT Diagnosis:    PT Problem List:   PT Treatment Interventions:     PT Goals Acute Rehab PT Goals Time For Goal Achievement: 11/03/12 Potential to Achieve Goals: Good Pt will Roll Supine to Right Side: with modified independence PT Goal: Rolling Supine to Right Side - Progress: Progressing toward goal Pt will go Supine/Side to Sit: with supervision PT Goal: Supine/Side to Sit - Progress: Met Pt will go Sit to Stand: with modified independence PT Goal: Sit to Stand - Progress: Progressing toward goal Pt will go Stand to Sit: with modified independence PT Goal: Stand to Sit - Progress: Progressing toward goal Pt will Ambulate: 51 - 150 feet;with modified independence PT Goal: Ambulate - Progress: Progressing toward goal Pt will Go Up / Down Stairs: 1-2 stairs;with modified independence;with least restrictive assistive device PT Goal: Up/Down Stairs - Progress: Goal set today Additional Goals Additional Goal #1: Pt. will state and adhere to 3/3 back precautions and log rolling technique PT Goal: Additional Goal #1 - Progress: Progressing toward goal  Visit Information  Last PT Received On: 11/09/12 Assistance Needed: +1    Subjective Data  Subjective: Pt reports she not feeling as well this morning.  Has a bit  of nausea.   Cognition   Cognition Overall Cognitive Status: Appears within functional limits for tasks assessed/performed Arousal/Alertness: Awake/alert Orientation Level: Appears intact for tasks assessed Behavior During Session: [facility] for tasks performed    Balance     End of Session PT - End of Session Equipment Utilized During Treatment: Gait belt;Back brace Activity Tolerance: Patient tolerated treatment well Patient left: in chair;with call bell/phone within reach Nurse Communication: Mobility status   GP     Enid Baas, SPTA 11/09/2012, 10:07 AM   Verdell Face, PTA [phone] 11/09/2012  Agree with above assessment.  Lewis Shock, PT, DPT Pager #: [phone] Office #: [phone]

## 2012-12-06 ENCOUNTER — Ambulatory Visit: Payer: BC Managed Care – PPO | Attending: Neurosurgery

## 2012-12-06 DIAGNOSIS — M545 Low back pain, unspecified: Secondary | ICD-10-CM | POA: Insufficient documentation

## 2012-12-06 DIAGNOSIS — R5381 Other malaise: Secondary | ICD-10-CM | POA: Insufficient documentation

## 2012-12-06 DIAGNOSIS — M25569 Pain in unspecified knee: Secondary | ICD-10-CM | POA: Insufficient documentation

## 2012-12-06 DIAGNOSIS — IMO0001 Reserved for inherently not codable concepts without codable children: Secondary | ICD-10-CM | POA: Insufficient documentation

## 2012-12-09 ENCOUNTER — Ambulatory Visit: Payer: BC Managed Care – PPO | Admitting: Physical Therapy

## 2012-12-13 ENCOUNTER — Ambulatory Visit: Payer: BC Managed Care – PPO | Admitting: Physical Therapy

## 2012-12-15 ENCOUNTER — Ambulatory Visit: Payer: BC Managed Care – PPO | Admitting: Physical Therapy

## 2012-12-20 ENCOUNTER — Ambulatory Visit: Payer: BC Managed Care – PPO

## 2012-12-23 ENCOUNTER — Ambulatory Visit: Payer: BC Managed Care – PPO | Admitting: Physical Therapy

## 2012-12-27 ENCOUNTER — Ambulatory Visit: Payer: BC Managed Care – PPO

## 2012-12-29 ENCOUNTER — Ambulatory Visit: Payer: BC Managed Care – PPO | Attending: Neurosurgery | Admitting: Physical Therapy

## 2012-12-29 DIAGNOSIS — M6281 Muscle weakness (generalized): Secondary | ICD-10-CM | POA: Insufficient documentation

## 2012-12-29 DIAGNOSIS — R262 Difficulty in walking, not elsewhere classified: Secondary | ICD-10-CM | POA: Insufficient documentation

## 2012-12-29 DIAGNOSIS — IMO0001 Reserved for inherently not codable concepts without codable children: Secondary | ICD-10-CM | POA: Insufficient documentation

## 2012-12-29 DIAGNOSIS — M545 Low back pain, unspecified: Secondary | ICD-10-CM | POA: Insufficient documentation

## 2012-12-29 DIAGNOSIS — R5381 Other malaise: Secondary | ICD-10-CM | POA: Insufficient documentation

## 2013-01-03 ENCOUNTER — Ambulatory Visit: Payer: BC Managed Care – PPO

## 2013-01-05 ENCOUNTER — Ambulatory Visit: Payer: BC Managed Care – PPO | Admitting: Physical Therapy

## 2013-01-10 ENCOUNTER — Ambulatory Visit: Payer: BC Managed Care – PPO

## 2013-01-12 ENCOUNTER — Ambulatory Visit: Payer: BC Managed Care – PPO

## 2013-01-17 ENCOUNTER — Ambulatory Visit: Payer: BC Managed Care – PPO | Admitting: Physical Therapy

## 2013-01-19 ENCOUNTER — Ambulatory Visit: Payer: BC Managed Care – PPO

## 2013-01-24 ENCOUNTER — Ambulatory Visit: Payer: BC Managed Care – PPO

## 2013-01-26 ENCOUNTER — Ambulatory Visit: Payer: BC Managed Care – PPO | Admitting: Physical Therapy

## 2013-01-30 ENCOUNTER — Ambulatory Visit: Payer: BC Managed Care – PPO | Attending: Neurosurgery | Admitting: Physical Therapy

## 2013-01-30 DIAGNOSIS — M6281 Muscle weakness (generalized): Secondary | ICD-10-CM | POA: Insufficient documentation

## 2013-01-30 DIAGNOSIS — IMO0001 Reserved for inherently not codable concepts without codable children: Secondary | ICD-10-CM | POA: Insufficient documentation

## 2013-01-30 DIAGNOSIS — M545 Low back pain, unspecified: Secondary | ICD-10-CM | POA: Insufficient documentation

## 2013-01-30 DIAGNOSIS — R262 Difficulty in walking, not elsewhere classified: Secondary | ICD-10-CM | POA: Insufficient documentation

## 2013-02-02 ENCOUNTER — Ambulatory Visit: Payer: BC Managed Care – PPO | Admitting: Physical Therapy

## 2013-02-06 ENCOUNTER — Ambulatory Visit: Payer: BC Managed Care – PPO | Admitting: Physical Therapy

## 2013-02-09 ENCOUNTER — Ambulatory Visit: Payer: BC Managed Care – PPO | Admitting: Physical Therapy

## 2013-02-13 ENCOUNTER — Ambulatory Visit: Payer: BC Managed Care – PPO | Admitting: Physical Therapy

## 2013-02-15 ENCOUNTER — Ambulatory Visit: Payer: BC Managed Care – PPO | Admitting: Physical Therapy

## 2013-02-21 ENCOUNTER — Ambulatory Visit: Payer: 59

## 2013-02-23 ENCOUNTER — Ambulatory Visit: Payer: BC Managed Care – PPO

## 2013-02-27 ENCOUNTER — Ambulatory Visit: Payer: 59

## 2013-03-02 ENCOUNTER — Ambulatory Visit: Payer: BC Managed Care – PPO | Attending: Neurosurgery | Admitting: Physical Therapy

## 2013-03-02 DIAGNOSIS — M6281 Muscle weakness (generalized): Secondary | ICD-10-CM | POA: Insufficient documentation

## 2013-03-02 DIAGNOSIS — IMO0001 Reserved for inherently not codable concepts without codable children: Secondary | ICD-10-CM | POA: Insufficient documentation

## 2013-03-02 DIAGNOSIS — M545 Low back pain, unspecified: Secondary | ICD-10-CM | POA: Insufficient documentation

## 2013-03-02 DIAGNOSIS — R262 Difficulty in walking, not elsewhere classified: Secondary | ICD-10-CM | POA: Insufficient documentation

## 2013-03-06 ENCOUNTER — Ambulatory Visit: Payer: BC Managed Care – PPO | Admitting: Physical Therapy

## 2013-03-08 ENCOUNTER — Ambulatory Visit: Payer: BC Managed Care – PPO

## 2013-03-09 ENCOUNTER — Ambulatory Visit: Payer: BC Managed Care – PPO | Admitting: Physical Therapy

## 2013-03-13 ENCOUNTER — Ambulatory Visit: Payer: BC Managed Care – PPO

## 2013-03-15 ENCOUNTER — Ambulatory Visit: Payer: BC Managed Care – PPO | Admitting: Physical Therapy

## 2013-03-20 ENCOUNTER — Ambulatory Visit: Payer: BC Managed Care – PPO | Admitting: Physical Therapy

## 2013-03-22 ENCOUNTER — Ambulatory Visit: Payer: BC Managed Care – PPO | Admitting: Physical Therapy

## 2013-03-24 ENCOUNTER — Encounter: Payer: 59 | Admitting: Physical Therapy

## 2013-03-27 ENCOUNTER — Ambulatory Visit: Payer: BC Managed Care – PPO | Admitting: Physical Therapy

## 2013-03-29 ENCOUNTER — Ambulatory Visit: Payer: BC Managed Care – PPO | Admitting: Physical Therapy

## 2013-04-03 ENCOUNTER — Ambulatory Visit: Payer: BC Managed Care – PPO | Attending: Neurosurgery | Admitting: Physical Therapy

## 2013-04-03 DIAGNOSIS — M6281 Muscle weakness (generalized): Secondary | ICD-10-CM | POA: Insufficient documentation

## 2013-04-03 DIAGNOSIS — IMO0001 Reserved for inherently not codable concepts without codable children: Secondary | ICD-10-CM | POA: Insufficient documentation

## 2013-04-03 DIAGNOSIS — M545 Low back pain, unspecified: Secondary | ICD-10-CM | POA: Insufficient documentation

## 2013-04-03 DIAGNOSIS — R262 Difficulty in walking, not elsewhere classified: Secondary | ICD-10-CM | POA: Insufficient documentation

## 2013-04-05 ENCOUNTER — Ambulatory Visit: Payer: BC Managed Care – PPO

## 2013-04-12 ENCOUNTER — Ambulatory Visit: Payer: BC Managed Care – PPO | Admitting: Physical Therapy

## 2013-04-13 ENCOUNTER — Ambulatory Visit: Payer: BC Managed Care – PPO | Admitting: Physical Therapy

## 2013-04-14 ENCOUNTER — Ambulatory Visit: Payer: BC Managed Care – PPO | Admitting: Physical Therapy

## 2013-04-19 ENCOUNTER — Ambulatory Visit: Payer: BC Managed Care – PPO | Admitting: Physical Therapy

## 2013-04-21 ENCOUNTER — Ambulatory Visit: Payer: BC Managed Care – PPO | Admitting: Physical Therapy

## 2013-04-24 ENCOUNTER — Ambulatory Visit: Payer: BC Managed Care – PPO

## 2013-04-26 ENCOUNTER — Ambulatory Visit: Payer: BC Managed Care – PPO | Admitting: Physical Therapy

## 2013-05-03 ENCOUNTER — Ambulatory Visit: Payer: 59 | Attending: Neurosurgery

## 2013-05-03 DIAGNOSIS — M545 Low back pain, unspecified: Secondary | ICD-10-CM | POA: Insufficient documentation

## 2013-05-03 DIAGNOSIS — R262 Difficulty in walking, not elsewhere classified: Secondary | ICD-10-CM | POA: Insufficient documentation

## 2013-05-03 DIAGNOSIS — IMO0001 Reserved for inherently not codable concepts without codable children: Secondary | ICD-10-CM | POA: Insufficient documentation

## 2013-05-03 DIAGNOSIS — M6281 Muscle weakness (generalized): Secondary | ICD-10-CM | POA: Insufficient documentation

## 2013-05-05 ENCOUNTER — Ambulatory Visit: Payer: 59 | Admitting: Physical Therapy

## 2013-05-10 ENCOUNTER — Encounter: Payer: 59 | Admitting: Physical Therapy

## 2013-05-12 ENCOUNTER — Encounter: Payer: 59 | Admitting: Physical Therapy

## 2013-07-03 ENCOUNTER — Other Ambulatory Visit: Payer: Self-pay

## 2013-07-03 DIAGNOSIS — Z1231 Encounter for screening mammogram for malignant neoplasm of breast: Secondary | ICD-10-CM

## 2013-07-04 ENCOUNTER — Other Ambulatory Visit: Payer: Self-pay | Admitting: Internal Medicine

## 2013-07-04 DIAGNOSIS — R918 Other nonspecific abnormal finding of lung field: Secondary | ICD-10-CM

## 2013-07-05 ENCOUNTER — Ambulatory Visit
Admission: RE | Admit: 2013-07-05 | Discharge: 2013-07-05 | Disposition: A | Discharge status: Home / Self Care | Payer: 59 | Source: Ambulatory Visit

## 2013-07-05 DIAGNOSIS — Z1231 Encounter for screening mammogram for malignant neoplasm of breast: Secondary | ICD-10-CM

## 2013-07-12 ENCOUNTER — Encounter: Payer: Self-pay | Admitting: Interventional Cardiology

## 2013-07-12 DIAGNOSIS — K589 Irritable bowel syndrome without diarrhea: Secondary | ICD-10-CM | POA: Insufficient documentation

## 2013-07-12 DIAGNOSIS — E079 Disorder of thyroid, unspecified: Secondary | ICD-10-CM | POA: Insufficient documentation

## 2013-07-12 DIAGNOSIS — E785 Hyperlipidemia, unspecified: Secondary | ICD-10-CM | POA: Insufficient documentation

## 2013-07-12 DIAGNOSIS — G4700 Insomnia, unspecified: Secondary | ICD-10-CM | POA: Insufficient documentation

## 2013-07-12 DIAGNOSIS — R918 Other nonspecific abnormal finding of lung field: Secondary | ICD-10-CM | POA: Insufficient documentation

## 2013-07-12 DIAGNOSIS — K219 Gastro-esophageal reflux disease without esophagitis: Secondary | ICD-10-CM | POA: Insufficient documentation

## 2013-07-12 DIAGNOSIS — C801 Malignant (primary) neoplasm, unspecified: Secondary | ICD-10-CM | POA: Insufficient documentation

## 2013-07-12 DIAGNOSIS — I471 Supraventricular tachycardia: Secondary | ICD-10-CM | POA: Insufficient documentation

## 2013-07-13 ENCOUNTER — Ambulatory Visit: Payer: 59 | Admitting: Interventional Cardiology

## 2013-07-18 ENCOUNTER — Encounter: Payer: Self-pay | Admitting: Interventional Cardiology

## 2013-07-19 ENCOUNTER — Ambulatory Visit
Admission: RE | Admit: 2013-07-19 | Discharge: 2013-07-19 | Disposition: A | Discharge status: Home / Self Care | Payer: 59 | Source: Ambulatory Visit | Attending: Internal Medicine | Admitting: Internal Medicine

## 2013-07-19 ENCOUNTER — Encounter: Payer: Self-pay | Admitting: Interventional Cardiology

## 2013-07-19 ENCOUNTER — Ambulatory Visit (INDEPENDENT_AMBULATORY_CARE_PROVIDER_SITE_OTHER): Payer: 59 | Admitting: Interventional Cardiology

## 2013-07-19 VITALS — BP 142/65 | HR 73 | Ht 68.0 in

## 2013-07-19 DIAGNOSIS — I471 Supraventricular tachycardia, unspecified: Secondary | ICD-10-CM

## 2013-07-19 DIAGNOSIS — E785 Hyperlipidemia, unspecified: Secondary | ICD-10-CM

## 2013-07-19 DIAGNOSIS — R918 Other nonspecific abnormal finding of lung field: Secondary | ICD-10-CM

## 2013-07-19 HISTORY — DX: Supraventricular tachycardia, unspecified: I47.10

## 2013-07-19 HISTORY — DX: Supraventricular tachycardia: I47.1

## 2013-07-19 NOTE — Patient Instructions (Signed)
Your physician recommends that you continue on your current medications as directed. Please refer to the Current Medication list given to you today.  Your physician wants you to follow-up in: 1-2 years You will receive a reminder letter in the mail two months in advance. If you don't receive a letter, please call our office to schedule the follow-up appointment.  Please call the office if you are experiencing prolonged arrythmia [phone]

## 2013-07-19 NOTE — Progress Notes (Signed)
Patient ID: Brenda Haas, female   DOB: 17-Jul-1951, 61 y.o.   MRN: [id number]    [address] [place], [place]  [address] Phone: [phone] Fax:  [phone]  Date:  07/19/2013   ID:  Brenda Haas, DOB 03-13-1951, MRN [id number]  PCP:  Laurell Josephs, MD   ASSESSMENT:  1. History of PSVT 2. Hyperlipidemia  PLAN:  1. clinical observation.   SUBJECTIVE: Brenda Haas is a 62 y.o. female with history of paroxysmal supraventricular tachycardia. No recent significant episodes. She underwent a significant lumbar spine procedure in February of 2014 and had no complications from cardiac standpoint. She denies episodes of syncope, chest pain, edema, and transient neurological symptoms.   Wt Readings from Last 3 Encounters:  10/26/12 189 lb 3.2 oz (85.821 kg)  10/26/12 189 lb 3.2 oz (85.821 kg)  10/20/12 189 lb 3.2 oz (85.821 kg)     Past Medical History  Diagnosis Date  . Hyperlipidemia     borderline  . Supraventricular tachycardia, paroxysmal   . Irritable bowel syndrome (IBS)   . Spastic colon   . Insomnia   . Hypercalcemia   . Multiple lung nodules     CT 2009  . Cancer     h/o squamous cell carcinoma  . Thyroid disease     goiter and right thyroid nodule  . Chronic headaches     controlled w/ fioricet  . Allergy   . Chronic low back pain   . Chronic pain disorder   . Complication of anesthesia     difficulty going to sleep  . PONV (postoperative nausea and vomiting)   . Family history of anesthesia complication     Son- difficulty waking up  . Constipation   . GERD (gastroesophageal reflux disease)     did take Nexium , not needed lately  . Hemorrhoids     Current Outpatient Prescriptions  Medication Sig Dispense Refill  . AFLURIA PRESERVATIVE FREE injection       . ALPRAZolam (XANAX) 0.5 MG tablet       . BIOTIN PO Take 10,000 mcg by mouth daily.       . Calcium Carbonate (CALTRATE 600 PO) Take 600 mg by mouth daily. 1500  mg 1 qd w/ food      . clobetasol cream (TEMOVATE) 0.05 % Apply 1 application topically daily as needed.       . diazepam (VALIUM) 5 MG tablet Take 5 mg by mouth at bedtime.       . gabapentin (NEURONTIN) 800 MG tablet Take 800 mg by mouth daily.       Marland Kitchen glucosamine-chondroitin 500-400 MG tablet Take 1 tablet by mouth daily.      . Omega-3 Fatty Acids (OMEGA 3 PO) Take 1 capsule by mouth daily.      . Oxycodone HCl 10 MG TABS       . Safflower Oil (CLA) 1000 MG CAPS Take 1,000 mg by mouth daily.      Marland Kitchen senna (SENOKOT) 8.6 MG TABS Take 2 tablets by mouth 3 (three) times daily.      . traZODone (DESYREL) 100 MG tablet Take 100 mg by mouth 2 (two) times daily.        No current facility-administered medications for this visit.    Allergies:    Allergies  Allergen Reactions  . Penicillins Swelling    Social History:  The patient  reports that she quit smoking about 8 years  ago. She has never used smokeless tobacco. She reports that she drinks alcohol. She reports that she does not use illicit drugs.   ROS:  Please see the history of present illness.   Continues to have back pain despite the extensive operation.   All other systems reviewed and negative.   OBJECTIVE: VS:  BP 142/65  Pulse 73  Ht 5\' 8"  (1.727 m) Well nourished, well developed, in no acute distress, healthy HEENT: normal Neck: JVD flat. Carotid bruit absent  Cardiac:  normal S1, S2; RRR; no murmur Lungs:  clear to auscultation bilaterally, no wheezing, rhonchi or rales Abd: soft, nontender, no hepatomegaly Ext: Edema absent. Pulses normal Skin: warm and dry Neuro:  CNs 2-12 intact, no focal abnormalities noted  EKG:  Incomplete right bundle branch block, sinus rhythm, leftward axis.       Signed, Darci Needle III, MD 07/19/2013 10:51 AM

## 2013-09-15 ENCOUNTER — Other Ambulatory Visit: Payer: Self-pay | Admitting: Family Medicine

## 2013-09-15 DIAGNOSIS — E049 Nontoxic goiter, unspecified: Secondary | ICD-10-CM

## 2013-09-22 ENCOUNTER — Ambulatory Visit
Admission: RE | Admit: 2013-09-22 | Discharge: 2013-09-22 | Disposition: A | Discharge status: Home / Self Care | Payer: 59 | Source: Ambulatory Visit | Attending: Family Medicine | Admitting: Family Medicine

## 2013-09-22 DIAGNOSIS — E049 Nontoxic goiter, unspecified: Secondary | ICD-10-CM

## 2014-03-08 ENCOUNTER — Ambulatory Visit (INDEPENDENT_AMBULATORY_CARE_PROVIDER_SITE_OTHER): Payer: PRIVATE HEALTH INSURANCE | Admitting: Women's Health

## 2014-03-08 ENCOUNTER — Encounter: Payer: Self-pay | Admitting: Women's Health

## 2014-03-08 VITALS — BP 125/82 | Ht 68.0 in | Wt 177.8 lb

## 2014-03-08 DIAGNOSIS — Z01419 Encounter for gynecological examination (general) (routine) without abnormal findings: Secondary | ICD-10-CM

## 2014-03-08 NOTE — Patient Instructions (Signed)
Health Recommendations for Postmenopausal Women Respected and ongoing research has looked at the most common causes of death, disability, and poor quality of life in postmenopausal women. The causes include heart disease, diseases of blood vessels, diabetes, depression, cancer, and bone loss (osteoporosis). Many things can be done to help lower the chances of developing these and other common problems: CARDIOVASCULAR DISEASE Heart Disease: A heart attack is a medical emergency. Know the signs and symptoms of a heart attack. Below are things women can do to reduce their risk for heart disease.   Do not smoke. If you smoke, quit.  Aim for a healthy weight. Being overweight causes many preventable deaths. Eat a healthy and balanced diet and drink an adequate amount of liquids.  Get moving. Make a commitment to be more physically active. Aim for 30 minutes of activity on most, if not all days of the week.  Eat for heart health. Choose a diet that is low in saturated fat and cholesterol and eliminate trans fat. Include whole grains, vegetables, and fruits. Read and understand the labels on food containers before buying.  Know your numbers. Ask your caregiver to check your blood pressure, cholesterol (total, HDL, LDL, triglycerides) and blood glucose. Work with your caregiver on improving your entire clinical picture.  High blood pressure. Limit or stop your table salt intake (try salt substitute and food seasonings). Avoid salty foods and drinks. Read labels on food containers before buying. Eating well and exercising can help control high blood pressure. STROKE  Stroke is a medical emergency. Stroke may be the result of a blood clot in a blood vessel in the brain or by a brain hemorrhage (bleeding). Know the signs and symptoms of a stroke. To lower the risk of developing a stroke:  Avoid fatty foods.  Quit smoking.  Control your diabetes, blood pressure, and irregular heart rate. THROMBOPHLEBITIS  (BLOOD CLOT) OF THE LEG  Becoming overweight and leading a stationary lifestyle may also contribute to developing blood clots. Controlling your diet and exercising will help lower the risk of developing blood clots. CANCER SCREENING  Breast Cancer: Take steps to reduce your risk of breast cancer.  You should practice "breast self-awareness." This means understanding the normal appearance and feel of your breasts and should include breast self-examination. Any changes detected, no matter how small, should be reported to your caregiver.  After age 40, you should have a clinical breast exam (CBE) every year.  Starting at age 40, you should consider having a mammogram (breast X-ray) every year.  If you have a family history of breast cancer, talk to your caregiver about genetic screening.  If you are at high risk for breast cancer, talk to your caregiver about having an MRI and a mammogram every year.  Intestinal or Stomach Cancer: Tests to consider are a rectal exam, fecal occult blood, sigmoidoscopy, and colonoscopy. Women who are high risk may need to be screened at an earlier age and more often.  Cervical Cancer:  Beginning at age 30, you should have a Pap test every 3 years as long as the past 3 Pap tests have been normal.  If you have had past treatment for cervical cancer or a condition that could lead to cancer, you need Pap tests and screening for cancer for at least 20 years after your treatment.  If you had a hysterectomy for a problem that was not cancer or a condition that could lead to cancer, then you no longer need Pap tests.    If you are between ages 65 and 70, and you have had normal Pap tests going back 10 years, you no longer need Pap tests.  If Pap tests have been discontinued, risk factors (such as a new sexual partner) need to be reassessed to determine if screening should be resumed.  Some medical problems can increase the chance of getting cervical cancer. In these  cases, your caregiver may recommend more frequent screening and Pap tests.  Uterine Cancer: If you have vaginal bleeding after reaching menopause, you should notify your caregiver.  Ovarian cancer: Other than yearly pelvic exams, there are no reliable tests available to screen for ovarian cancer at this time except for yearly pelvic exams.  Lung Cancer: Yearly chest X-rays can detect lung cancer and should be done on high risk women, such as cigarette smokers and women with chronic lung disease (emphysema).  Skin Cancer: A complete body skin exam should be done at your yearly examination. Avoid overexposure to the sun and ultraviolet light lamps. Use a strong sun block cream when in the sun. All of these things are important in lowering the risk of skin cancer. MENOPAUSE Menopause Symptoms: Hormone therapy products are effective for treating symptoms associated with menopause:  Moderate to severe hot flashes.  Night sweats.  Mood swings.  Headaches.  Tiredness.  Loss of sex drive.  Insomnia.  Other symptoms. Hormone replacement carries certain risks, especially in older women. Women who use or are thinking about using estrogen or estrogen with progestin treatments should discuss that with their caregiver. Your caregiver will help you understand the benefits and risks. The ideal dose of hormone replacement therapy is not known. The Food and Drug Administration (FDA) has concluded that hormone therapy should be used only at the lowest doses and for the shortest amount of time to reach treatment goals.  OSTEOPOROSIS Protecting Against Bone Loss and Preventing Fracture: If you use hormone therapy for prevention of bone loss (osteoporosis), the risks for bone loss must outweigh the risk of the therapy. Ask your caregiver about other medications known to be safe and effective for preventing bone loss and fractures. To guard against bone loss or fractures, the following is recommended:  If  you are less than age 50, take 1000 mg of calcium and at least 600 mg of Vitamin D per day.  If you are greater than age 50 but less than age 70, take 1200 mg of calcium and at least 600 mg of Vitamin D per day.  If you are greater than age 70, take 1200 mg of calcium and at least 800 mg of Vitamin D per day. Smoking and excessive alcohol intake increases the risk of osteoporosis. Eat foods rich in calcium and vitamin D and do weight bearing exercises several times a week as your caregiver suggests. DIABETES Diabetes Melitus: If you have Type I or Type 2 diabetes, you should keep your blood sugar under control with diet, exercise and recommended medication. Avoid too many sweets, starchy and fatty foods. Being overweight can make control more difficult. COGNITION AND MEMORY Cognition and Memory: Menopausal hormone therapy is not recommended for the prevention of cognitive disorders such as Alzheimer's disease or memory loss.  DEPRESSION  Depression may occur at any age, but is common in elderly women. The reasons may be because of physical, medical, social (loneliness), or financial problems and needs. If you are experiencing depression because of medical problems and control of symptoms, talk to your caregiver about this. Physical activity and   exercise may help with mood and sleep. Community and volunteer involvement may help your sense of value and worth. If you have depression and you feel that the problem is getting worse or becoming severe, talk to your caregiver about treatment options that are best for you. ACCIDENTS  Accidents are common and can be serious in the elderly woman. Prepare your house to prevent accidents. Eliminate throw rugs, place hand bars in the bath, shower and toilet areas. Avoid wearing high heeled shoes or walking on wet, snowy, and icy areas. Limit or stop driving if you have vision or hearing problems, or you feel you are unsteady with you movements and  reflexes. HEPATITIS C Hepatitis C is a type of viral infection affecting the liver. It is spread mainly through contact with blood from an infected person. It can be treated, but if left untreated, it can lead to severe liver damage over years. Many people who are infected do not know that the virus is in their blood. If you are a "baby-boomer", it is recommended that you have one screening test for Hepatitis C. IMMUNIZATIONS  Several immunizations are important to consider having during your senior years, including:   Tetanus, diptheria, and pertussis booster shot.  Influenza every year before the flu season begins.  Pneumonia vaccine.  Shingles vaccine.  Others as indicated based on your specific needs. Talk to your caregiver about these. Document Released: 10/09/2005 Document Revised: 08/03/2012 Document Reviewed: 06/04/2008 [facility] Patient Information 2015 [place], [place]. This information is not intended to replace advice given to you by your health care provider. Make sure you discuss any questions you have with your health care provider.

## 2014-03-08 NOTE — Progress Notes (Signed)
Brenda Haas [birth year] [id number]  History:    Presents as new patient for annual exam. Postmenopausal/no bleeding/no HRT. One abnormal Pap in 80's with normal Paps after Normal mammogram history. 2007 colonoscopy normal. Reports vaginal dryness, dyspareunia, and constipation. Has not had zostavax. Normal DEXA. 13 lumbar fusion, negative thyroid biopsy.  Past medical history, past surgical history, family history and social history were all reviewed and documented in the EPIC chart. Works at [employer], moved from [place], [place]. Mother died bladder cancer.  ROS:  A  12 point ROS was performed and pertinent positives and negatives are included.  Exam:  Filed Vitals:   03/08/14 1022  BP: 125/82    General appearance:  Normal Thyroid:  Symmetrical, normal in size, without palpable masses or nodularity. Respiratory  Auscultation:  Clear without wheezing or rhonchi Cardiovascular  Auscultation:  Regular rate, without rubs, murmurs or gallops  Edema/varicosities:  Not grossly evident Abdominal  Soft,nontender, without masses, guarding or rebound.  Liver/spleen:  No organomegaly noted  Hernia:  None appreciated  Skin  Inspection:  Grossly normal   Breasts: Examined lying and sitting.     Right: Without masses, retractions, discharge or axillary adenopathy.     Left: Without masses, retractions, discharge or axillary adenopathy. Gentitourinary   Inguinal/mons:  Normal without inguinal adenopathy  External genitalia:  Normal  BUS/Urethra/Skene's glands:  Normal  Vagina:  Atrophic  Cervix:  Normal  Uterus:  anteverted, normal in size, shape and contour.  Midline and mobile  Adnexa/parametria:     Rt: Without masses or tenderness.   Lt: Without masses or tenderness.  Anus and perineum: Normal  Digital rectal exam: Normal sphincter tone without palpated masses or tenderness  Assessment/Plan:  63 y.o.  G3P2 MWF for annual exam.   Postmenopausal/vaginal  atrophy/dyspareunia Constipation Chronic back pain  Plan: Labs-primary care .SBE's, continue annual mammograms. Heart healthy diet and exercise reviewed. Injectable lubricants recommended, Vagifem reviewed and declined. Increase fiber rich foods, fluids, and D/C calcium supplement to help with constipation. Zostavax declined. Pap normal 2013 in [place], Pap today with HR HPV typing, new screening regulations reviewed. DEXA, will schedule.  Note: This dictation was prepared with Dragon/digital dictation.  Any transcriptional errors that result are unintentional. Huel Cote [facility], 10:48 AM 03/08/2014

## 2014-08-15 ENCOUNTER — Other Ambulatory Visit: Payer: Self-pay

## 2014-08-15 DIAGNOSIS — Z1231 Encounter for screening mammogram for malignant neoplasm of breast: Secondary | ICD-10-CM

## 2014-09-11 ENCOUNTER — Ambulatory Visit
Admission: RE | Admit: 2014-09-11 | Discharge: 2014-09-11 | Disposition: A | Discharge status: Home / Self Care | Payer: 59 | Source: Ambulatory Visit

## 2014-09-11 DIAGNOSIS — Z1231 Encounter for screening mammogram for malignant neoplasm of breast: Secondary | ICD-10-CM

## 2014-10-18 IMAGING — US US SOFT TISSUE HEAD/NECK
1 series · 13 of 25 positions shown · non-contrast
Comparison: None.

CLINICAL DATA: Prominent thyroid on physical exam

THYROID ULTRASOUND
TECHNIQUE: Ultrasound examination of the thyroid gland and adjacent
soft tissues was performed.

[Series 1: us soft tissue head/neck · 0.10mm/px · 13 of 72 slices shown]
[im 1/72]
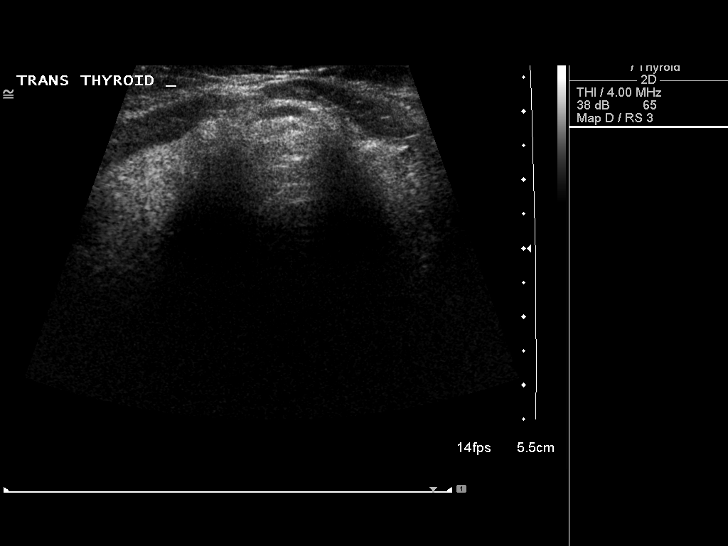
[im 6/72]
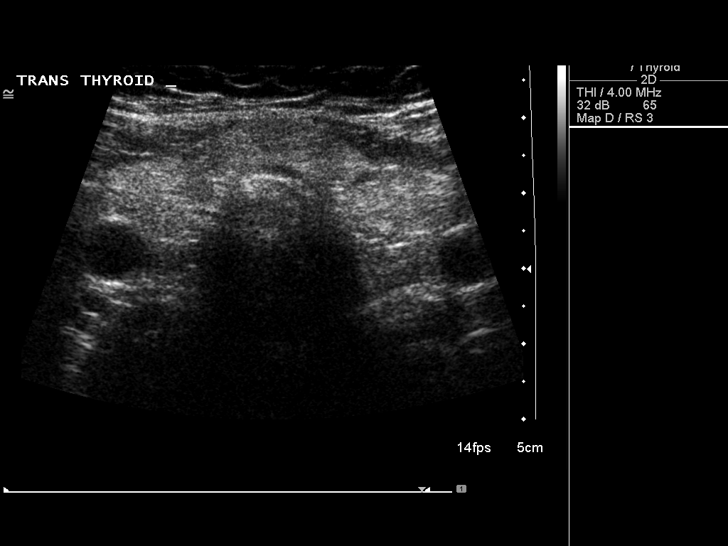
[im 12/72]
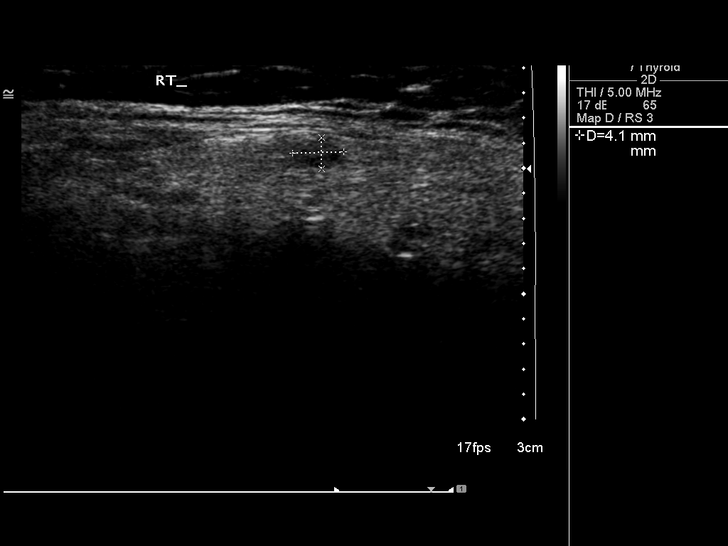
[im 18/72]
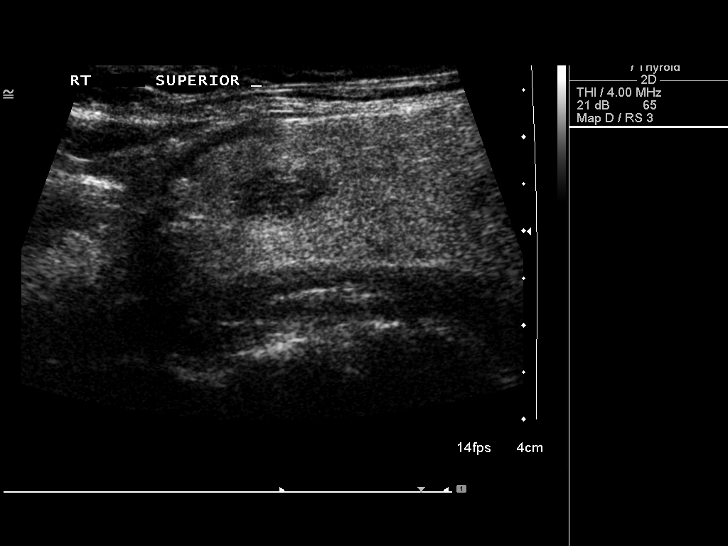
[im 24/72]
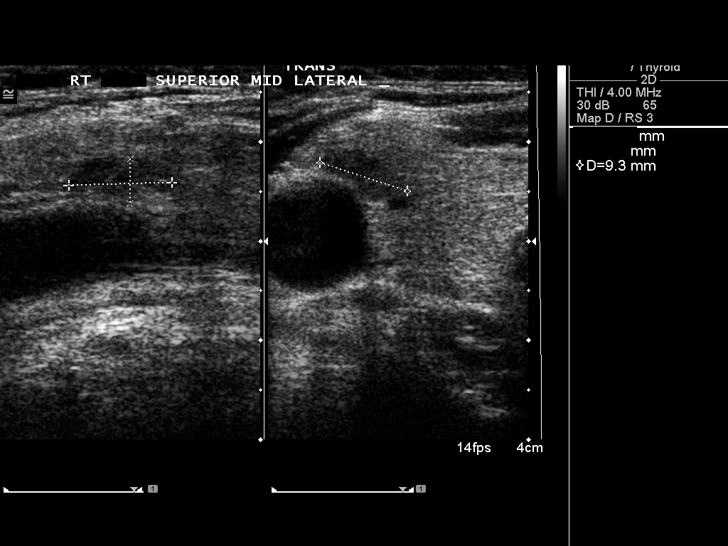
[im 30/72]
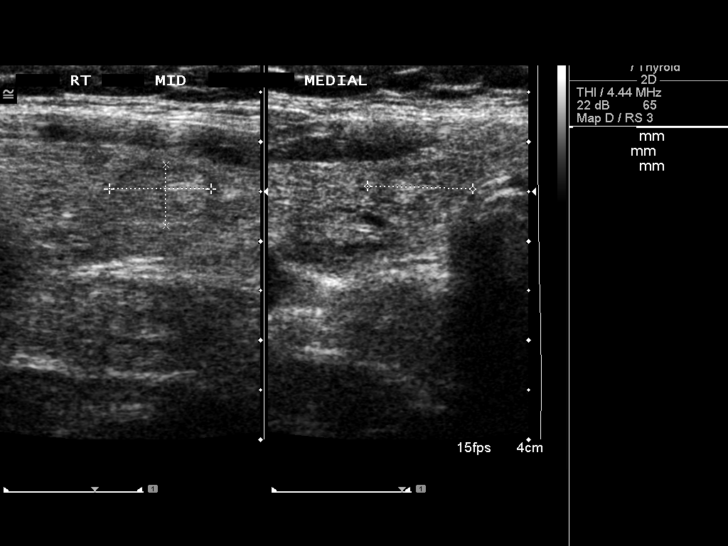
[im 36/72]
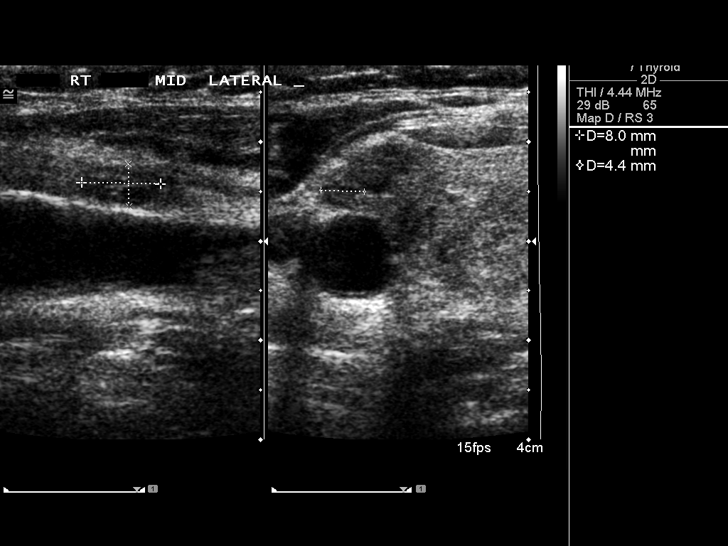
[im 42/72]
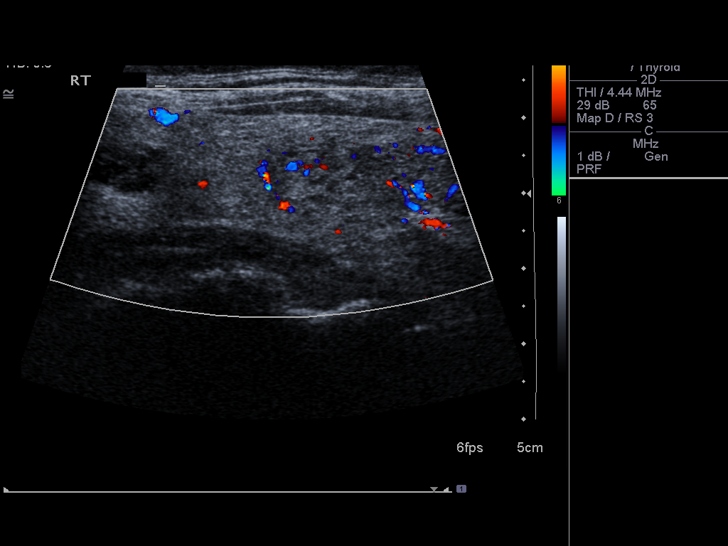
[im 48/72]
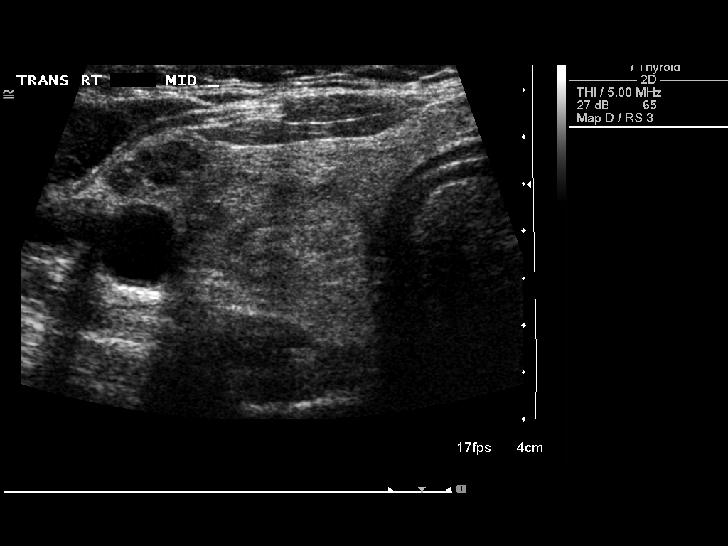
[im 54/72]
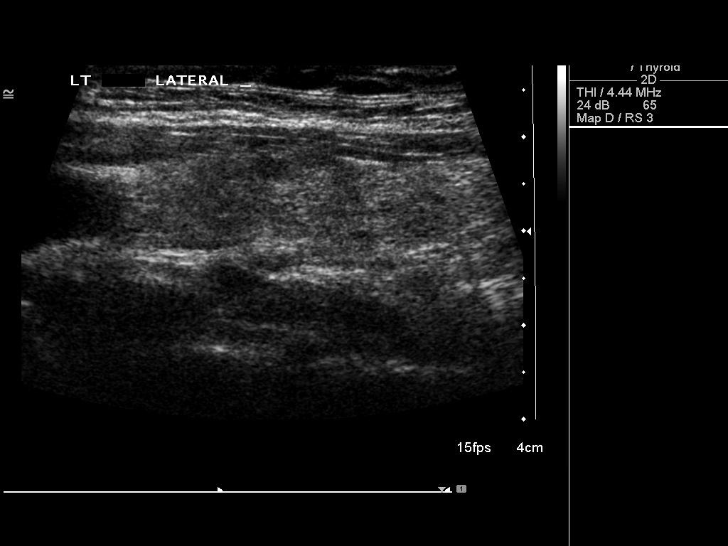
[im 60/72]
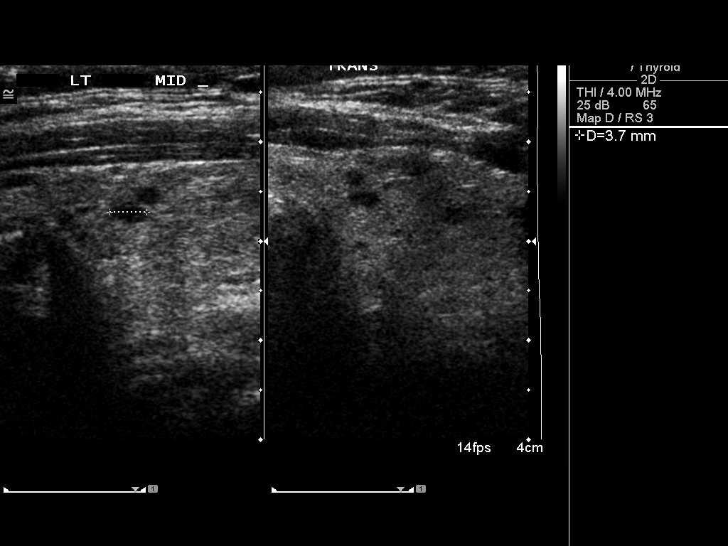
[im 66/72]
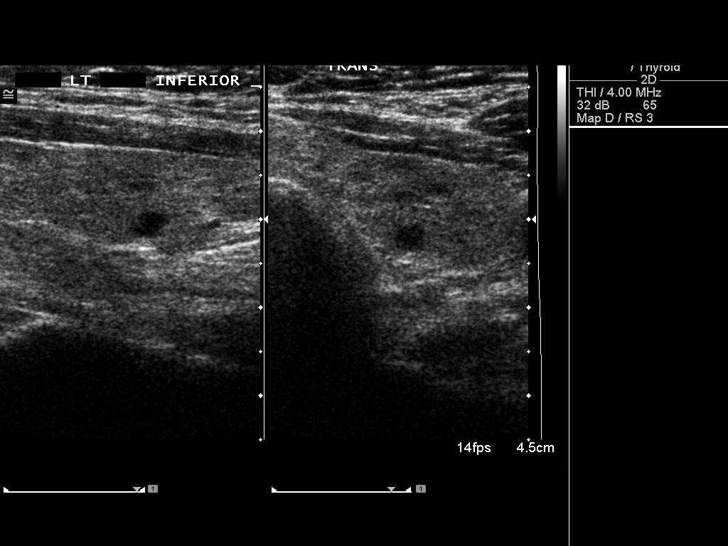
[im 72/72]
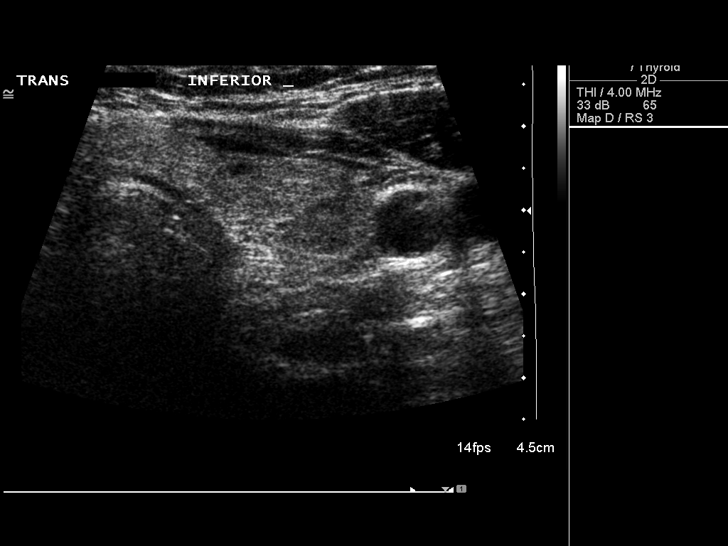

[13 of 25 positions shown; findings below may reference images not displayed]

FINDINGS: Right thyroid lobe:  6.8 x 2.6 x 3.1 cm.
Left thyroid lobe:  6.3 x 1.4 x 2.1 cm.
Isthmus:  6.5 mm in thickness.

Focal nodules:  The echogenicity of the thyroid parenchyma is
inhomogeneous.  Multiple nodules are scattered throughout both
lobes of thyroid.  The dominant nodule is in the mid right lobe of
thyroid measuring 2.6 x 1.3 x 2.0 cm with indistinct margins.  A
nodule in the lower pole of the right lobe measures 1.6 x 1.0 x
cm and is solid as well. Findings meet consensus criteria for
biopsy.  Ultrasound-guided fine needle aspiration should be
considered, as per the consensus statement: Management of Thyroid
Nodules Detected at US:  Society of Radiologists in Ultrasound
Multiple additional nodules are present throughout the right lobe
of no more than 11 mm in diameter.

On the right small nodules present none larger than 7 mm in
diameter.

Lymphadenopathy:  None visualized.
IMPRESSION: 1.  Enlarged inhomogeneous thyroid with multiple nodules most
consistent with multinodular goiter.
2.  One or two nodules on the right could be biopsied, meeting
criteria currently for biopsy.

## 2014-11-20 IMAGING — CR DG LUMBAR SPINE 1V
1 series · 1 of 1 positions shown · non-contrast
Comparison: CT myelogram 09/02/2012

CLINICAL DATA: Posterior lumbar fusion

LUMBAR SPINE - 1 VIEW

[view not recorded]
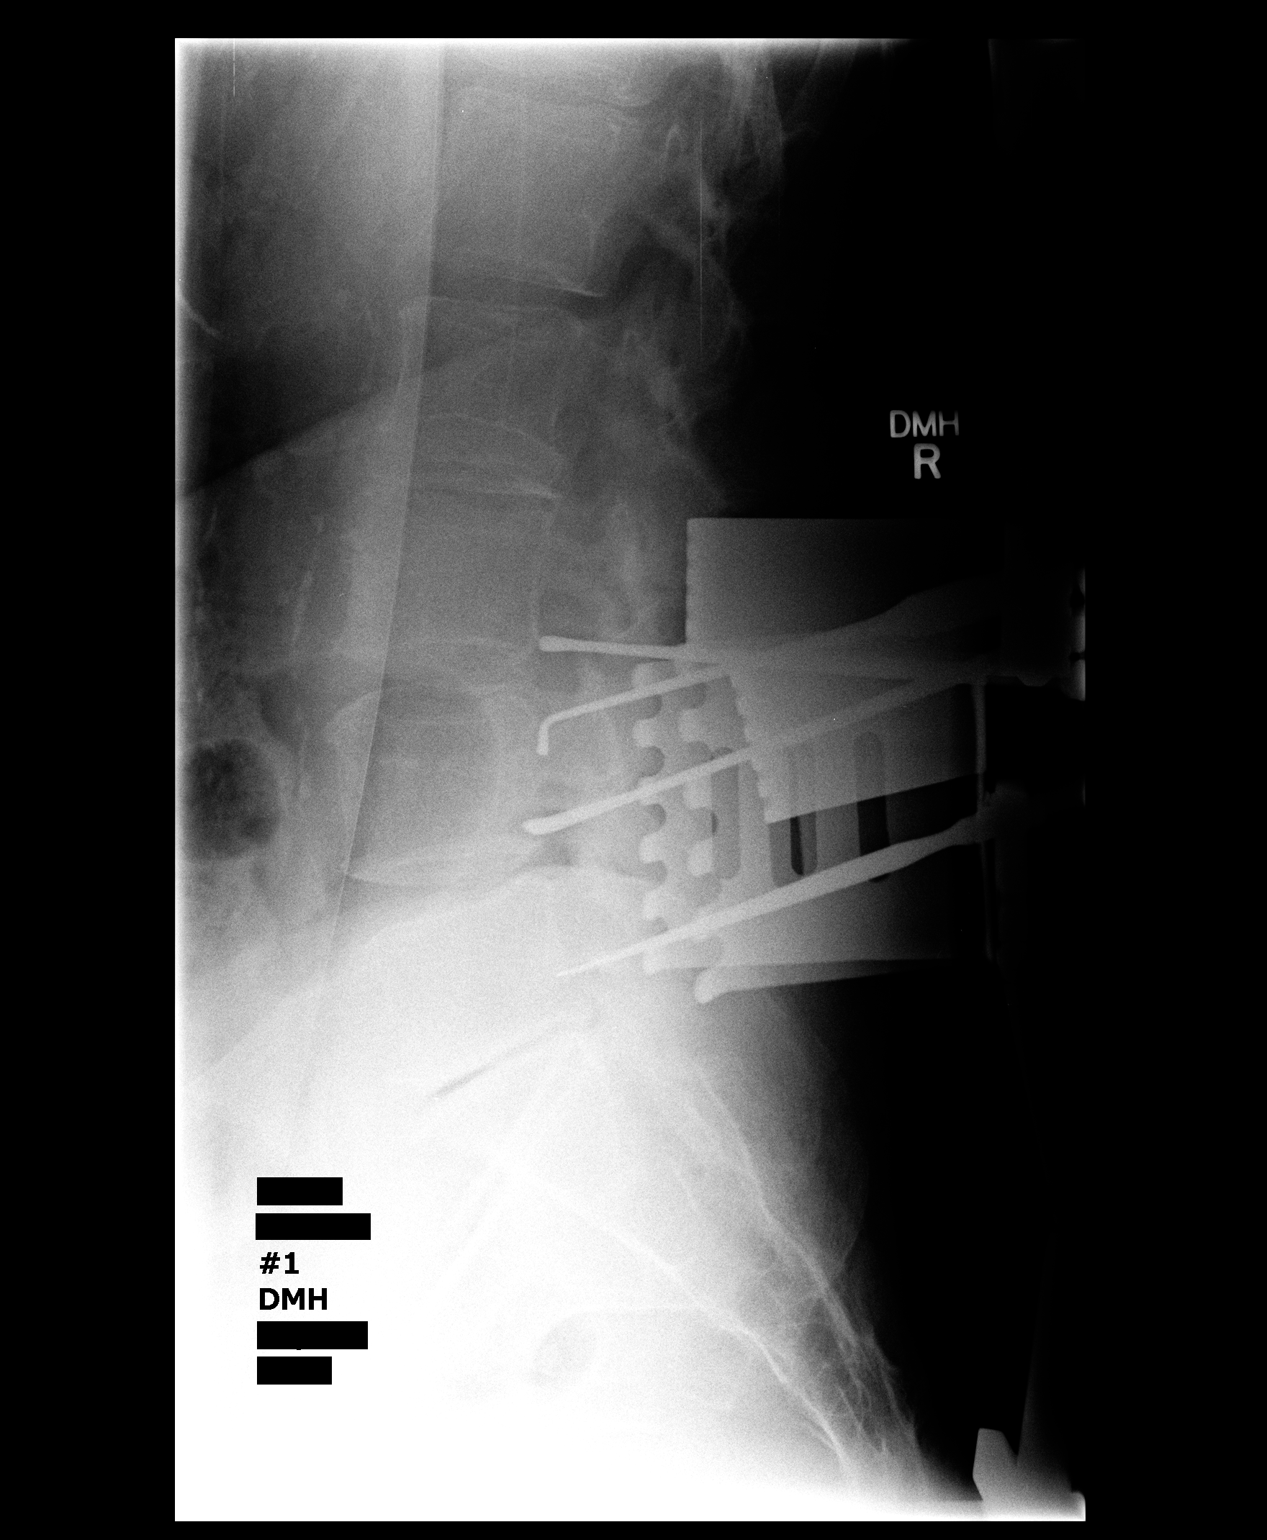

[1 of 1 positions shown; findings below may reference images not displayed]

FINDINGS: Posterior surgical instruments are in place and extend
from the inferior aspect of L3 to the inferior aspect of L5.
IMPRESSION: Intraoperative localization as above.

## 2014-11-21 ENCOUNTER — Other Ambulatory Visit: Payer: Self-pay | Admitting: Family Medicine

## 2014-11-21 DIAGNOSIS — E049 Nontoxic goiter, unspecified: Secondary | ICD-10-CM

## 2014-11-22 IMAGING — US US THYROID BIOPSY
1 series · 13 of 16 positions shown · non-contrast
Comparison: none

INDICATION: Indeterminate right-sided thyroid nodules

[Series 1: us thyroid biopsy · 0.07mm/px · 16 acquisitions, 13 frames shown]
[im 1/16]
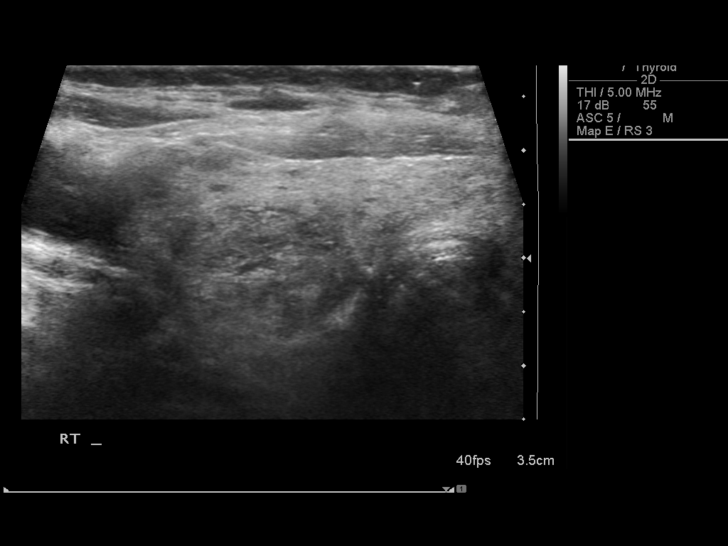
[im 2/16]
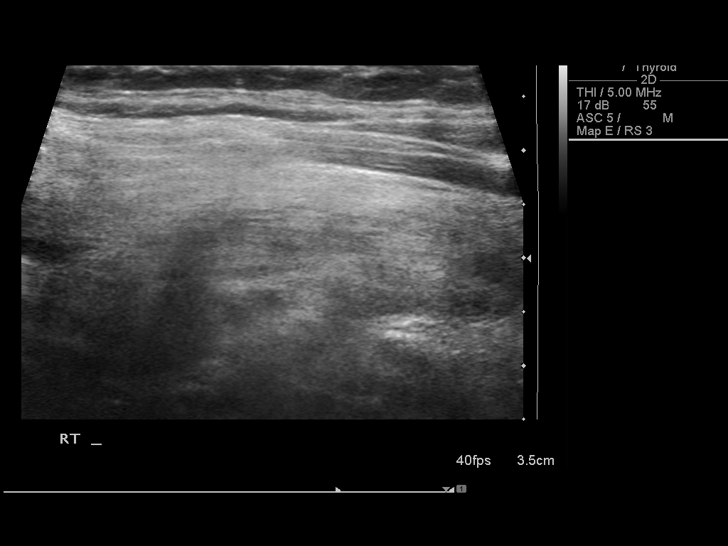
[im 4/16]
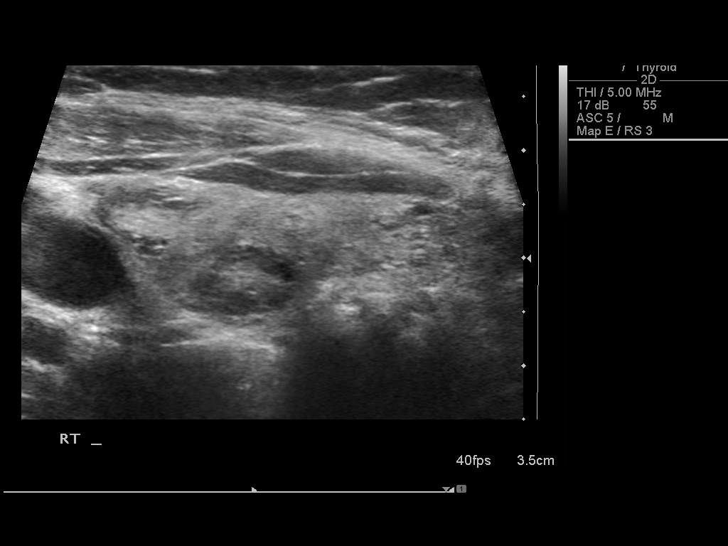
[im 5/16]
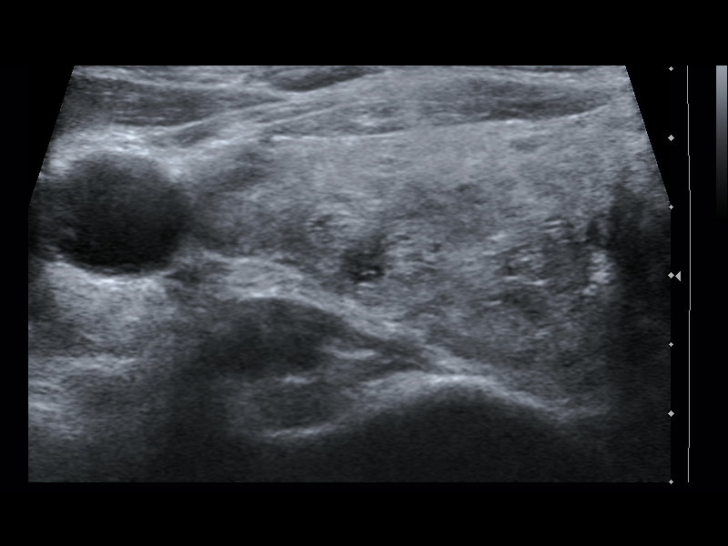
[im 6/16]
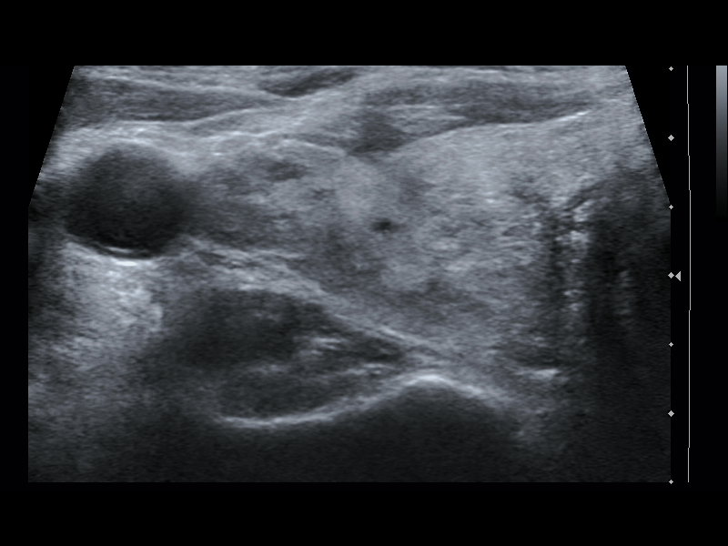
[im 7/16]
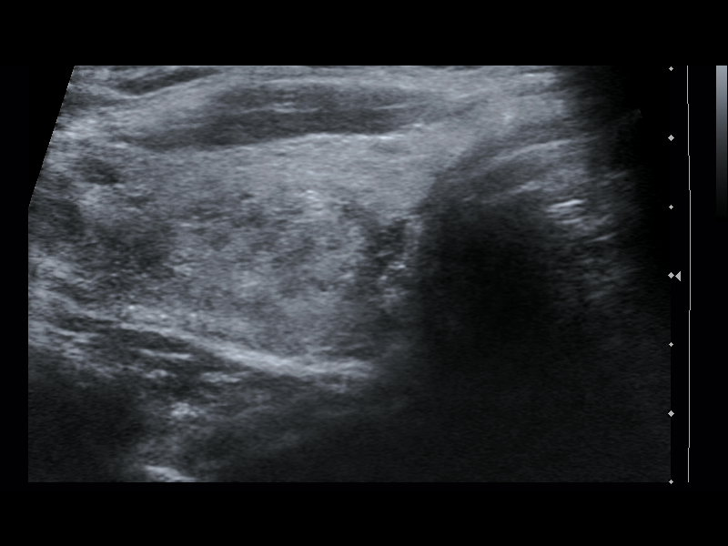
[im 9/16]
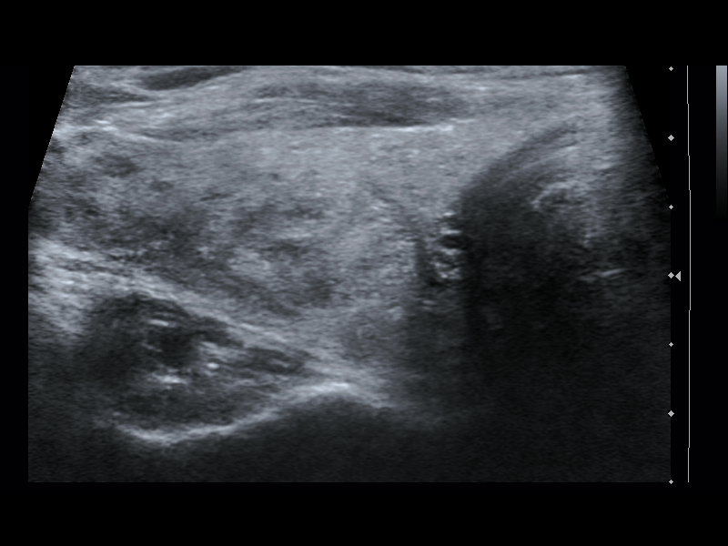
[im 10/16]
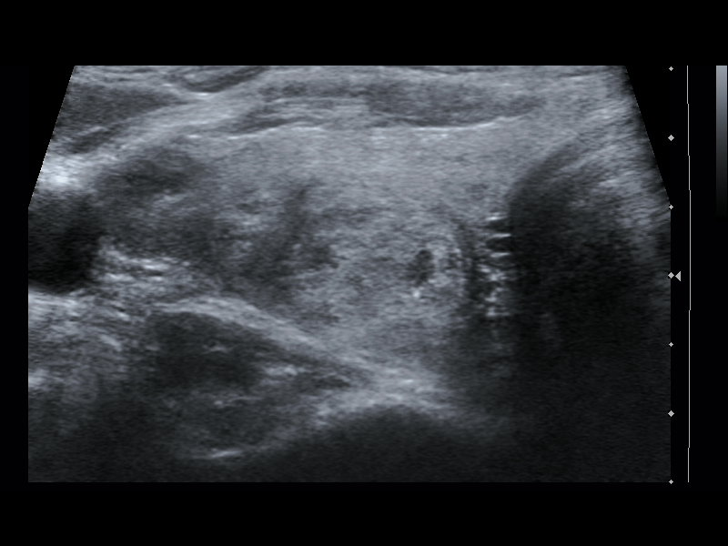
[im 11/16]
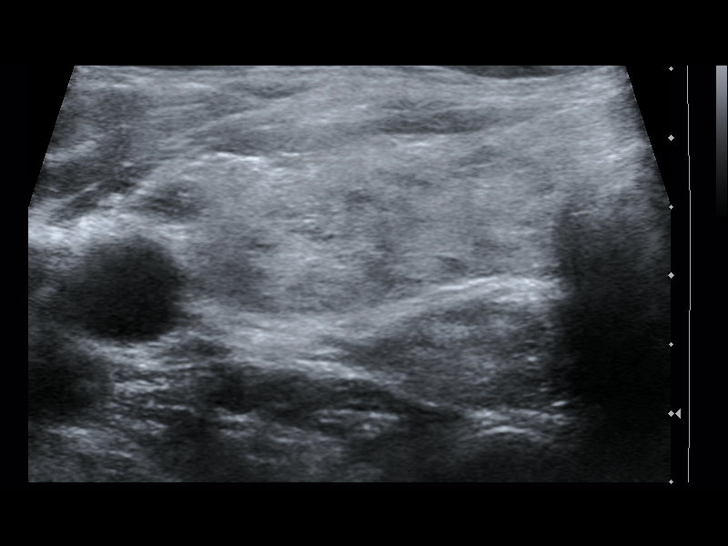
[im 12/16]
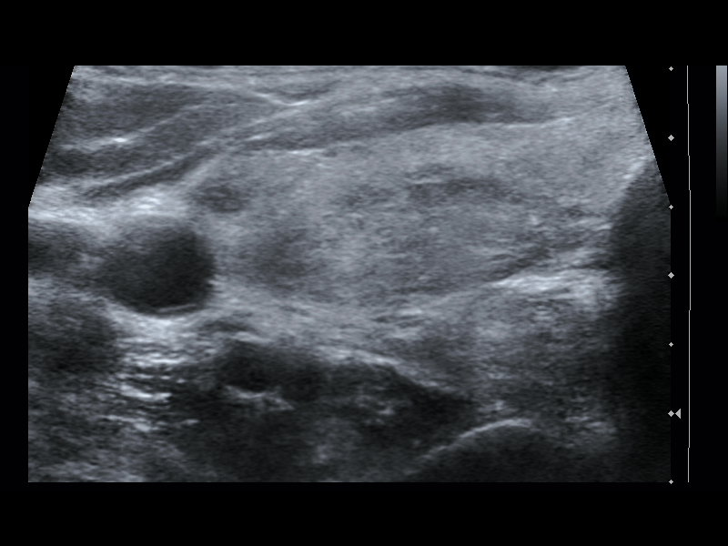
[im 13/16]
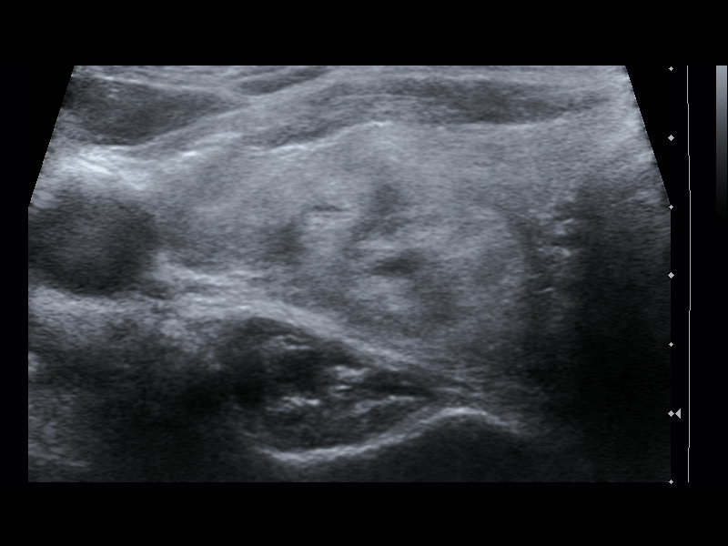
[im 15/16]
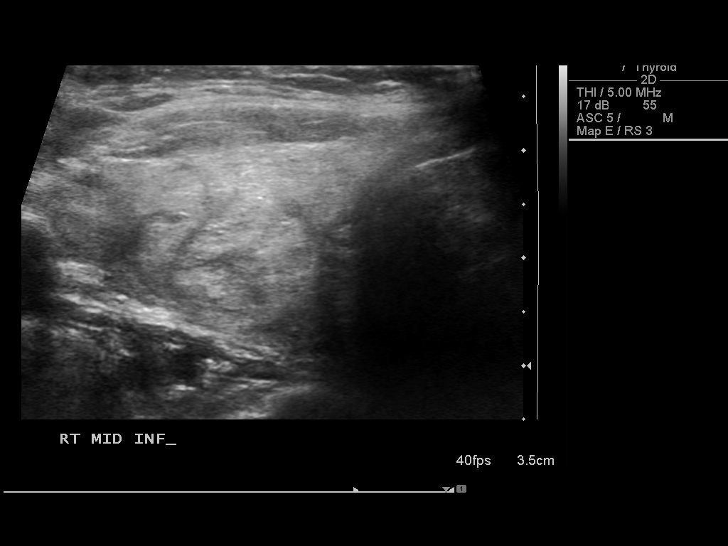
[im 16/16]
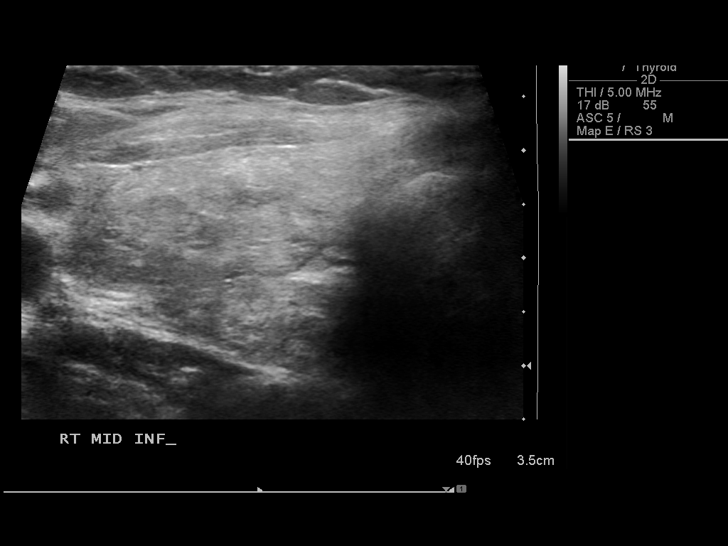

[13 of 16 positions shown; findings below may reference images not displayed]

ULTRASOUND GUIDED THYROID FINE NEEDLE ASPIRATION

Comparisons: Thyroid Ultrasound - 09/23/2012

Intravenous Medications: Fentanyl 50 mcg IV; Versed 2 mg IV

Sedation time:  20 minutes

Complications: None immediate

Technique / Findings:

Informed written consent was obtained from the patient after a
discussion of the risks, benefits and alternatives to treatment.
Questions regarding the procedure were encouraged and answered.  A
timeout was performed prior to the initiation of the procedure.

Pre-procedural ultrasound scanning demonstrated grossly unchanged
appearance of indeterminate mixed echogenic nodule within the mid-
aspect of the right lobe of the thyroid (image 2) which correlates
with the approximately 2.0 cm nodule seen on image 29 of
preprocedural thyroid ultrasound.

Grossly unchanged appearance of the bilobed partially exophytic
solid nodule arising from the mid/inferior, posterior aspect of the
right lobe of the thyroid (image 17) - note, this nodule was
previously described as two discrete nodules though on real-time
scanning appears to represent one dominant nodule with a small
partially exophytic component.

The procedures were planned.  The neck was prepped in the usual
sterile fashion, and a sterile drape was applied covering the
operative field.  A timeout was performed prior to the initiation
of the procedure.  Local anesthesia was provided with 1% lidocaine.

Under direct ultrasound guidance, 4 FNA biopsies were performed of
dominant nodule within the mid aspect of the right lobe of the
thyroid with a 25 gauge needle.  The samples were prepared and
submitted to pathology.

Under direct ultrasound guidance, 4 FNA biopsies were performed of
the nodule within the mid/inferior, posterior aspect of the right
lobe the thyroid with a 25 gauge needle.  The samples were prepared
and submitted to pathology.

Limited post procedural scanning was negative for hematoma or
additional complication.  A dressing was placed.  The patient
tolerated procedure well without immediate postprocedural
complication.
IMPRESSION: Technically successful ultrasound guided fine needle aspiration of
dominant right-sided thyroid nodules as above.

## 2014-12-26 ENCOUNTER — Ambulatory Visit (INDEPENDENT_AMBULATORY_CARE_PROVIDER_SITE_OTHER): Payer: 59 | Admitting: Women's Health

## 2014-12-26 ENCOUNTER — Telehealth: Payer: Self-pay | Admitting: *Deleted

## 2014-12-26 ENCOUNTER — Encounter: Payer: Self-pay | Admitting: Women's Health

## 2014-12-26 VITALS — BP 138/80 | Wt 181.0 lb

## 2014-12-26 DIAGNOSIS — N644 Mastodynia: Secondary | ICD-10-CM

## 2014-12-26 NOTE — Patient Instructions (Signed)

## 2014-12-26 NOTE — Progress Notes (Signed)
Patient ID: Brenda Haas, female   DOB: May 04, 1951, 64 y.o.   MRN: [id number] Presents with complaint of breast pain,  both both breasts had been tender, now mostly left breast outer aspect tender to touch. Was having itchy sensation at [place] relieved with cortisone cream. No known injury. No HRT. No family history of breast cancer. Denies fever, nipple discharge. Normal mammogram 08/2014.  Exam: Appears slightly anxious, breast exam in sitting and lying position without visible retractions or dimpling, bilateral nipple inversion reports as always. No palpable nodules, tenderness in left outer aspect.  Mastodynia  Plan: Left breast diagnostic mammogram. States leaving in 3 days to [place] for one month. Will try to schedule prior to leaving. If unable to get scheduled prior to departure will try to have scheduled in [place], prescription given. (Originally from [place]) instructed to avoid caffeinated beverages, OTC  vitamin E twice daily.

## 2014-12-26 NOTE — Telephone Encounter (Signed)
-----   Message from Huel Cote, NP sent at 12/26/2014  2:45 PM EDT ----- Need diagnostic mammogram, after 2:30 or all day Friday.  Out of town month of May

## 2014-12-26 NOTE — Telephone Encounter (Signed)
Appointment 01/26/15 @ 2:30pm at [facility] center pt aware

## 2014-12-27 ENCOUNTER — Ambulatory Visit
Admission: RE | Admit: 2014-12-27 | Discharge: 2014-12-27 | Disposition: A | Discharge status: Home / Self Care | Payer: 59 | Source: Ambulatory Visit | Attending: Women's Health | Admitting: Women's Health

## 2014-12-27 DIAGNOSIS — N644 Mastodynia: Secondary | ICD-10-CM

## 2015-01-02 ENCOUNTER — Other Ambulatory Visit: Payer: Self-pay | Admitting: Neurosurgery

## 2015-01-02 DIAGNOSIS — M47812 Spondylosis without myelopathy or radiculopathy, cervical region: Secondary | ICD-10-CM

## 2015-02-15 ENCOUNTER — Encounter: Payer: Self-pay | Admitting: Interventional Cardiology

## 2015-02-15 ENCOUNTER — Ambulatory Visit
Admission: RE | Admit: 2015-02-15 | Discharge: 2015-02-15 | Disposition: A | Discharge status: Home / Self Care | Payer: 59 | Source: Ambulatory Visit | Attending: Neurosurgery | Admitting: Neurosurgery

## 2015-02-15 ENCOUNTER — Ambulatory Visit (INDEPENDENT_AMBULATORY_CARE_PROVIDER_SITE_OTHER): Payer: 59 | Admitting: Interventional Cardiology

## 2015-02-15 ENCOUNTER — Ambulatory Visit
Admission: RE | Admit: 2015-02-15 | Discharge: 2015-02-15 | Disposition: A | Discharge status: Home / Self Care | Payer: 59 | Source: Ambulatory Visit | Attending: Family Medicine | Admitting: Family Medicine

## 2015-02-15 VITALS — BP 164/100 | HR 65 | Ht 68.0 in | Wt 177.8 lb

## 2015-02-15 DIAGNOSIS — E049 Nontoxic goiter, unspecified: Secondary | ICD-10-CM

## 2015-02-15 DIAGNOSIS — I471 Supraventricular tachycardia: Secondary | ICD-10-CM

## 2015-02-15 DIAGNOSIS — M47812 Spondylosis without myelopathy or radiculopathy, cervical region: Secondary | ICD-10-CM

## 2015-02-15 DIAGNOSIS — E785 Hyperlipidemia, unspecified: Secondary | ICD-10-CM | POA: Diagnosis not present

## 2015-02-15 NOTE — Progress Notes (Signed)
Cardiology Office Note   Date:  02/15/2015   ID:  Brenda Haas, DOB [birth year], MRN [id number]  PCP:  Juanell Fairly, MD  Cardiologist:  Sinclair Grooms, MD   Chief Complaint  Patient presents with  . Palpitations      History of Present Illness: Brenda Haas is a 64 y.o. female who presents for follow-up of PSVT and hyperlipidemia.  Patient is doing well. She is having difficulty with her back and left leg. Blood pressures aren't sometimes elevated when recorded. She denies chest pain. Frequency of PSVT has significantly diminished now that she is not working. She feels that her problem does not require medical therapy because it is so infrequent.     Past Medical History  Diagnosis Date  . Hyperlipidemia     borderline  . Supraventricular tachycardia, paroxysmal   . Irritable bowel syndrome (IBS)   . Spastic colon   . Insomnia   . Hypercalcemia   . Multiple lung nodules     CT 2009  . Cancer     h/o squamous cell carcinoma  . Thyroid disease     goiter and right thyroid nodule  . Chronic headaches     controlled w/ fioricet  . Allergy   . Chronic low back pain   . Chronic pain disorder   . Complication of anesthesia     difficulty going to sleep  . PONV (postoperative nausea and vomiting)   . Family history of anesthesia complication     Son- difficulty waking up  . Constipation   . GERD (gastroesophageal reflux disease)     did take Nexium , not needed lately  . Hemorrhoids     Past Surgical History  Procedure Laterality Date  . Knee arthroscopy    . Dilation and curettage of uterus    . Colonscopy    . Tonsillectomy    . Posterior lumbar fusion 4 level N/A 10/26/2012    Procedure: POSTERIOR LUMBAR FUSION 4 LEVEL;  Surgeon: Floyce Stakes, MD;  Location: [facility];  Service: Neurosurgery;  Laterality: N/A;  Lumbar two-three, lumbar three-four,lumbar four-five,Lumbar five-Sacral oneDiskectomy/Fusion/Cages/Pedicle  screws/Posterolateral arthrodesis/Cellsaver C-Arm     Current Outpatient Prescriptions  Medication Sig Dispense Refill  . ALPRAZolam (XANAX) 0.5 MG tablet Take 0.5 mg by mouth at bedtime as needed for anxiety.     Marland Kitchen BIOTIN PO Take 10,000 mcg by mouth daily.     . clobetasol cream (TEMOVATE) 7.82 % Apply 1 application topically daily as needed.     . cyclobenzaprine (FLEXERIL) 10 MG tablet Take 10 mg by mouth as needed.   0  . diazepam (VALIUM) 5 MG tablet Take 5 mg by mouth at bedtime.     . gabapentin (NEURONTIN) 400 MG capsule Take 1 capsule by mouth 2 (two) times daily.  1  . gabapentin (NEURONTIN) 800 MG tablet Take 800 mg by mouth daily.     Marland Kitchen glucosamine-chondroitin 500-400 MG tablet Take 1 tablet by mouth daily.    . Omega-3 Fatty Acids (OMEGA 3 PO) Take 1 capsule by mouth daily.    . Oxycodone HCl 10 MG TABS Take 10 mg by mouth as needed (back pain).     . Probiotic Product (PROBIOTIC DAILY PO) Take 1 tablet by mouth daily.    Marland Kitchen rOPINIRole (REQUIP) 0.5 MG tablet Take 1 tablet by mouth as needed.  5  . senna (SENOKOT) 8.6 MG TABS Take 2 tablets by mouth 2 (two) times  daily.      No current facility-administered medications for this visit.    Allergies:   Penicillins    Social History:  The patient  reports that she quit smoking about 10 years ago. She has never used smokeless tobacco. She reports that she drinks alcohol. She reports that she does not use illicit drugs.   Family History:  The patient's family history includes Congestive Heart Failure in her mother; Heart disease in her mother; Thyroid disease in her mother. She was adopted.    ROS:  Please see the history of present illness.   Otherwise, review of systems are positive for back discomfort, pain in the left leg, difficulty sleeping, sweating, shortness of breath related to COPD, headaches, constipation, and difficulty urinating..   All other systems are reviewed and negative.    PHYSICAL EXAM: VS:  Ht 5\' 8"   (1.727 m)  Wt 80.65 kg (177 lb 12.8 oz)  BMI 27.04 kg/m2 , BMI Body mass index is 27.04 kg/(m^2). GEN: Well nourished, well developed, in no acute distress HEENT: normal Neck: no JVD, carotid bruits, or masses Cardiac: RRR; no murmurs, rubs, or gallops,no edema  Respiratory:  clear to auscultation bilaterally, normal work of breathing GI: soft, nontender, nondistended, + BS MS: no deformity or atrophy Skin: warm and dry, no rash Neuro:  Strength and sensation are intact Psych: euthymic mood, full affect   EKG:  EKG is ordered today. The ekg ordered today demonstrates normal sinus rhythm, left atrial abnormality, incomplete right bundle branch block, left axis deviation, but otherwise unremarkable.   Recent Labs: No results found for requested labs within last 365 days.    Lipid Panel No results found for: CHOL, TRIG, HDL, CHOLHDL, VLDL, LDLCALC, LDLDIRECT    Wt Readings from Last 3 Encounters:  02/15/15 80.65 kg (177 lb 12.8 oz)  12/26/14 82.101 kg (181 lb)  03/08/14 80.65 kg (177 lb 12.8 oz)      Other studies Reviewed: Additional studies/ records that were reviewed today include: . Review of the above records demonstrates: None   ASSESSMENT AND PLAN:  PSVT (paroxysmal supraventricular tachycardia) - decrease frequency  Hyperlipidemia  Elevated blood pressure without diagnosis of hypertension    Current medicines are reviewed at length with the patient today.  The patient does not have concerns regarding medicines.  The following changes have been made:  Low salt diet, monitor blood pressure twice per week for the next month and send in results. We did discuss the possibility of starting low-dose diltiazem for blood pressure smoothing and give additional detection against PSVT.  Labs/ tests ordered today include:  No orders of the defined types were placed in this encounter.     Disposition:   FU with HS in 1 year  Signed, Sinclair Grooms, MD    02/15/2015 8:05 AM    [facility], [place], [place]  [address] Phone: [phone]; Fax: [phone]

## 2015-02-15 NOTE — Patient Instructions (Signed)
Medication Instructions:  Your physician recommends that you continue on your current medications as directed. Please refer to the Current Medication list given to you today.  Labwork: None   Testing/Procedures: None   Follow-Up: Your physician wants you to follow-up in: 1 year with Dr.Smith You will receive a reminder letter in the mail two months in advance. If you don't receive a letter, please call our office to schedule the follow-up appointment.   Any Other Special Instructions Will Be Listed Below (If Applicable). Your physician has requested that you regularly monitor and record your blood pressure readings at home. Please use the same machine at the same time of day to check your readings. (Measure your blood pressure 2-3 times a week for 1 month and call the office with your readings).  Follow a low sodium diet

## 2015-04-25 ENCOUNTER — Telehealth: Payer: Self-pay | Admitting: Interventional Cardiology

## 2015-04-25 NOTE — Telephone Encounter (Signed)
New message     Pt c/o BP issue: STAT if pt c/o blurred vision, one-sided weakness or slurred speech  1. What are your last 5 BP readings?  7-6- 167/100, 7-7 155/99, 7-9 204/114 and 168/121, 7-10 156/85, 7-22 178/105 and 172/96, 8-20 136/95, 8-25 205/110 and 191/110  2. Are you having any other symptoms (ex. Dizziness, headache, blurred vision, passed out)? Palpitations for the last 2 weeks lasting about 5 minutes. Headaches everyday relieved by excedrin  3. What is your BP issue? Calling to give bp readings

## 2015-04-25 NOTE — Telephone Encounter (Signed)
bp readings fwd to Dr.Smith to review

## 2015-04-25 NOTE — Telephone Encounter (Signed)
Start Bystolic 10 mg daily and continue to monitor blood pressure. She will need a one-month follow-up with me

## 2015-04-30 MED ORDER — NEBIVOLOL HCL 10 MG PO TABS: 10.0000 mg | ORAL_TABLET | Freq: Every day | ORAL | Status: DC

## 2015-04-30 NOTE — Telephone Encounter (Signed)
Pt aware of Dr.Smith's recommendation. Start Bystolic 10 mg daily and continue to monitor blood pressure. She will need a one-month follow-up with me Rx sen to pt pharmacy. appt scheduled with Dr.Smith for 10/4 @ 10:45am. Pt verbalized understanding.

## 2015-04-30 NOTE — Telephone Encounter (Signed)
Called to give pt Dr.Smiths recommendation. lmtcb 

## 2015-05-23 ENCOUNTER — Telehealth: Payer: Self-pay | Admitting: Interventional Cardiology

## 2015-05-23 NOTE — Telephone Encounter (Signed)
BP READING AFTER STARTING NEW MEDS: 9-1- 171/82 9-2 167/88, 9-6 207/90, 9-7 153/70, 9-9- 178/80 and 164/78 9-19- 129/76- pt's insuranee co requested rx for BP cuff if pt needs to continue checking it-can call  [phone] option 1 [place] [employer]  -pls call [phone] ok to leave message if need to talk to pt

## 2015-05-23 NOTE — Telephone Encounter (Signed)
Start diltiazem CD 180 mg daily. She should purchase a blood pressure cuff. She she'll record her blood pressures to 3 times per week (2-3 hours after taking the diltiazem) and report values to [place].

## 2015-05-23 NOTE — Telephone Encounter (Signed)
Message fwd to Dr.Smith to review pt bp readings.  Called pt ins co. Pt plan does cover a bp machine @ 100%. Pt would need to contact an in-network DME co. We could give her a written Rx. Ins co.recommended [facility] [phone].

## 2015-05-24 NOTE — Telephone Encounter (Signed)
Called to give pt Dr.Smiths recommendation. lmtcb 

## 2015-05-24 NOTE — Telephone Encounter (Signed)
Pt aware of Dr.Smith's recommendation. Pt wants to hold off on starting Diltiazem. Pt sts that the last 2-3 times she has checked her bp her readings were great 120's/70's. Pt sts that she will measure her bp over the next couple of days and call back early next week with readings. If bp is still elevated she will go ahead and start Diltiazem.  Pt rqst a written Rx for a bp machine be mailed to her home. done

## 2015-05-27 MED ORDER — DILTIAZEM HCL ER COATED BEADS 180 MG PO CP24: 180.0000 mg | ORAL_CAPSULE | Freq: Every day | ORAL | Status: DC

## 2015-05-27 NOTE — Telephone Encounter (Signed)
New message    Patient calling    Pt c/o BP issue: STAT if pt c/o blurred vision, one-sided weakness or slurred speech  1. What are your last 5 BP readings? 9/17 148/83, 9/24 151/85 ,  9/25 184/88   2. Are you having any other symptoms (ex. Dizziness, headache, blurred vision, passed out)? No   3. What is your BP issue? Was told to call back

## 2015-05-27 NOTE — Telephone Encounter (Signed)
Spoke with pt and advised her to go ahead and start Diltiazem CD 180mg  QD. Advised pt to monitor BP 2-3 times a week, 2-3 hrs after taking Diltiazem and call our office with those readings next week. Pt verbalized understanding and was in agreement with this plan.

## 2015-05-28 NOTE — Telephone Encounter (Signed)
Start HCTZ 12.5 mg daily. BMET 1 week Continue to monitor BP.

## 2015-05-29 NOTE — Telephone Encounter (Signed)
Routed back to Dr.Smith for clarification

## 2015-05-30 NOTE — Telephone Encounter (Signed)
I was confused by the multiple messages. I thought the reported blood pressures were those after diltiazem was started. If we don't have post diltiazem pressure recordings, HCTZ and the more recent recommendation should not be carried out. I need to know the post diltiazem blood pressures when available.

## 2015-05-30 NOTE — Telephone Encounter (Signed)
I do not want HCTZ started and to we know post diltiazem blood pressure recordings.

## 2015-05-31 ENCOUNTER — Other Ambulatory Visit: Payer: Self-pay

## 2015-06-04 ENCOUNTER — Ambulatory Visit (INDEPENDENT_AMBULATORY_CARE_PROVIDER_SITE_OTHER): Payer: 59 | Admitting: Interventional Cardiology

## 2015-06-04 ENCOUNTER — Encounter: Payer: Self-pay | Admitting: Interventional Cardiology

## 2015-06-04 VITALS — BP 144/88 | HR 71 | Ht 68.0 in | Wt 183.0 lb

## 2015-06-04 DIAGNOSIS — I10 Essential (primary) hypertension: Secondary | ICD-10-CM | POA: Diagnosis not present

## 2015-06-04 DIAGNOSIS — E785 Hyperlipidemia, unspecified: Secondary | ICD-10-CM | POA: Diagnosis not present

## 2015-06-04 DIAGNOSIS — I471 Supraventricular tachycardia: Secondary | ICD-10-CM | POA: Diagnosis not present

## 2015-06-04 HISTORY — DX: Essential (primary) hypertension: I10

## 2015-06-04 NOTE — Progress Notes (Signed)
Cardiology Office Note   Date:  06/04/2015   ID:  Brenda Haas, DOB 03-04-1951, MRN [id number]  PCP:  Juanell Fairly, MD  Cardiologist:  Sinclair Grooms, MD   Chief Complaint  Patient presents with  . Hypertension      History of Present Illness: Brenda Haas is a 64 y.o. female who presents for  PSVT and hypertension.   Blood pressures were running high so therapy was started.  Bystolic was too expensive. She is now on diltiazem 180 mg daily. No side effects. Moderate blood pressure control with the medication. No PSVT.    Past Medical History  Diagnosis Date  . Hyperlipidemia     borderline  . Supraventricular tachycardia, paroxysmal ([place])   . Irritable bowel syndrome (IBS)   . Spastic colon   . Insomnia   . Hypercalcemia   . Multiple lung nodules     CT 2009  . Cancer ([place])     h/o squamous cell carcinoma  . Thyroid disease     goiter and right thyroid nodule  . Chronic headaches     controlled w/ fioricet  . Allergy   . Chronic low back pain   . Chronic pain disorder   . Complication of anesthesia     difficulty going to sleep  . PONV (postoperative nausea and vomiting)   . Family history of anesthesia complication     Son- difficulty waking up  . Constipation   . GERD (gastroesophageal reflux disease)     did take Nexium , not needed lately  . Hemorrhoids   . PSVT (paroxysmal supraventricular tachycardia) ([place]) 07/19/2013  . Essential hypertension 06/04/2015    Past Surgical History  Procedure Laterality Date  . Knee arthroscopy    . Dilation and curettage of uterus    . Colonscopy    . Tonsillectomy    . Posterior lumbar fusion 4 level N/A 10/26/2012    Procedure: POSTERIOR LUMBAR FUSION 4 LEVEL;  Surgeon: Floyce Stakes, MD;  Location: [facility];  Service: Neurosurgery;  Laterality: N/A;  Lumbar two-three, lumbar three-four,lumbar four-five,Lumbar five-Sacral oneDiskectomy/Fusion/Cages/Pedicle screws/Posterolateral  arthrodesis/Cellsaver C-Arm     Current Outpatient Prescriptions  Medication Sig Dispense Refill  . ALPRAZolam (XANAX) 0.5 MG tablet Take 0.5 mg by mouth at bedtime as needed for anxiety.     Marland Kitchen BIOTIN PO Take 10,000 mcg by mouth daily.     Marland Kitchen BREO ELLIPTA 200-25 MCG/INH AEPB TAKE 1 PUFF BY MOUTH EVERY DAY  5  . clobetasol cream (TEMOVATE) 1.28 % Apply 1 application topically daily as needed (PSORASIS).     . cyclobenzaprine (FLEXERIL) 10 MG tablet Take 10 mg by mouth as needed for muscle spasms.   0  . diazepam (VALIUM) 5 MG tablet Take 5 mg by mouth at bedtime.     Marland Kitchen diltiazem (CARDIZEM CD) 180 MG 24 hr capsule Take 1 capsule (180 mg total) by mouth daily. 90 capsule 3  . gabapentin (NEURONTIN) 400 MG capsule Take 1 capsule by mouth 2 (two) times daily.  1  . gabapentin (NEURONTIN) 800 MG tablet Take 800 mg by mouth at bedtime.     Marland Kitchen glucosamine-chondroitin 500-400 MG tablet Take 1 tablet by mouth daily.    . Omega-3 Fatty Acids (OMEGA 3 PO) Take 1 capsule by mouth daily.    . Oxycodone HCl 10 MG TABS Take 10 mg by mouth as needed (back pain).     Marland Kitchen PROAIR HFA 108 (90 BASE)  MCG/ACT inhaler Use as directed as needed for wheezing or shortness of breath.    . Probiotic Product (PROBIOTIC DAILY PO) Take 1 tablet by mouth daily.    Marland Kitchen rOPINIRole (REQUIP) 0.5 MG tablet Take 1 tablet by mouth at bedtime as needed (RESTLESS LEG SYNDROME).   5  . senna (SENOKOT) 8.6 MG TABS Take 2 tablets by mouth 2 (two) times daily.     Marland Kitchen zolpidem (AMBIEN) 10 MG tablet TAKE 1 TABLET BY MOUTH AT BEDTIME (MAX DAILY DOSE:10MG )  5   No current facility-administered medications for this visit.    Allergies:   Penicillins    Social History:  The patient  reports that she quit smoking about 10 years ago. She has never used smokeless tobacco. She reports that she drinks alcohol. She reports that she does not use illicit drugs.   Family History:  The patient's She was adopted. Family history is unknown by patient.     ROS:  Please see the history of present illness.   Otherwise, review of systems are positive for  Shortness of breath, wheezing, constipation, leg pain, sweating, unexplained weight gain, back pain, muscle pain, and headaches..   All other systems are reviewed and negative.    PHYSICAL EXAM: VS:  BP 144/88 mmHg  Pulse 71  Ht 5\' 8"  (1.727 m)  Wt 83.008 kg (183 lb)  BMI 27.83 kg/m2  SpO2 96% , BMI Body mass index is 27.83 kg/(m^2). GEN: Well nourished, well developed, in no acute distress HEENT: normal Neck: no JVD, carotid bruits, or masses Cardiac: RRR.  There  is no murmur, rub, or gallop. There is  no edema. Respiratory:  clear to auscultation bilaterally, normal work of breathing. GI: soft, nontender, nondistended, + BS MS: no deformity or atrophy Skin: warm and dry, no rash Neuro:  Strength and sensation are intact Psych: euthymic mood, full affect   EKG:  EKG is not ordered today   Recent Labs: No results found for requested labs within last 365 days.    Lipid Panel No results found for: CHOL, TRIG, HDL, CHOLHDL, VLDL, LDLCALC, LDLDIRECT    Wt Readings from Last 3 Encounters:  06/04/15 83.008 kg (183 lb)  02/15/15 80.65 kg (177 lb 12.8 oz)  12/26/14 82.101 kg (181 lb)      Other studies Reviewed: Additional studies/ records that were reviewed today include: phone messages, complaining of shortness of breath on by systolic and also complaints about the cost of the medication...    ASSESSMENT AND PLAN:  1. PSVT (paroxysmal supraventricular tachycardia) ([facility]) No recurrence  2. Hyperlipidemia Low-fat diet  3. Essential hypertension Better control on low-dose diltiazem.    Current medicines are reviewed at length with the patient today.  The patient has the following concerns regarding medicines: none.  The following changes/actions have been instituted:    Continue to monitor blood pressure with goal 140/90 or less  Low-salt diet  Aerobic  activity  Labs/ tests ordered today include:  No orders of the defined types were placed in this encounter.     Disposition:   FU with HS in 1 year  Signed, Sinclair Grooms, MD  06/04/2015 1:08 PM    [facility] [address], [place], [place]  [address] Phone: [phone]; Fax: [phone]

## 2015-06-04 NOTE — Patient Instructions (Signed)
Medication Instructions:  Your physician recommends that you continue on your current medications as directed. Please refer to the Current Medication list given to you today.   Labwork: None ordered  Testing/Procedures: None ordered  Follow-Up: Your physician wants you to follow-up in: 1 year with Dr.Smith You will receive a reminder letter in the mail two months in advance. If you don't receive a letter, please call our office to schedule the follow-up appointment.   Any Other Special Instructions Will Be Listed Below (If Applicable). Please limit your sodium intake  Your physician has requested that you regularly monitor and record your blood pressure readings at home. Please use the same machine at the same time of day to check your readings and record them.

## 2015-06-20 ENCOUNTER — Telehealth: Payer: Self-pay | Admitting: Interventional Cardiology

## 2015-06-20 NOTE — Telephone Encounter (Signed)
Pt BP readings fwd to Dr.Smith to review an advise

## 2015-06-20 NOTE — Telephone Encounter (Signed)
New Message  Pt c/o BP issue:  1. What are your last 5 BP readings?   10/05 140/97  10/06 160/88 10/07 159/97 10/08 159/96 10/09 ? 10/10 ? 10/11 158/90           142/89           184/98   10/12 142/89           184/98 @ 7pm 10/13 ? 10/14 159/96 10/15 141/89            149/91 10/16 149/91 10/17 ? 10/18 141/82            171/86 10/19 ? 10/20 ?  2. Are you having any other symptoms (ex. Dizziness, headache, blurred vision, passed out)? NO.. But she always has headaches  3. What is your medication issue? Pt states that she runs out of her BP med's on 10/30. So if you plan to change it please let her know before she goes out of town

## 2015-06-21 NOTE — Telephone Encounter (Signed)
Increase Cardizem CD to 240 mg daily. Continue to monitor BP and call again with resukts in 2 weeks after dose increase.

## 2015-06-25 ENCOUNTER — Telehealth: Payer: Self-pay | Admitting: Interventional Cardiology

## 2015-06-25 ENCOUNTER — Other Ambulatory Visit: Payer: Self-pay | Admitting: *Deleted

## 2015-06-25 MED ORDER — DILTIAZEM HCL ER BEADS 240 MG PO CP24: 240.0000 mg | ORAL_CAPSULE | Freq: Every day | ORAL | Status: DC

## 2015-06-25 NOTE — Telephone Encounter (Signed)
Patient was returning a call. Informed patient her new prescription has been sent in with the new dose of Diltiazem 240 mg by mouth daily. Patient stated she is going to start new dose on Sunday 06/30/2015, and she will start taking her BP readings at that time.

## 2015-06-25 NOTE — Telephone Encounter (Signed)
New message      STAT if patient is at the pharmacy , call can be transferred to refill team.   1. Which medications need to be refilled? Diltiazem 180 mg or  240mg    2. Which pharmacy/location is medication to be sent to? [facility] on [address]   3. Do they need a 30 day or 90 day supply? 30 days supply

## 2015-06-25 NOTE — Telephone Encounter (Signed)
See phone notes from 10/25.

## 2015-06-25 NOTE — Telephone Encounter (Signed)
New message    Patient calling wants to know did Dr. Tamala Julian increase her Diltiazem  180 mg . From previous message on  10.21.2016

## 2015-06-25 NOTE — Telephone Encounter (Deleted)
error 

## 2015-06-25 NOTE — Telephone Encounter (Signed)
Called pt due to having refills on file, in the course of the conversation found out there was a potential medication change, looked back chart and Dr. Tamala Julian increased her Diltiazem to 240 mg daily w/2 week check in of BP reading, [facility], have informed pt of change and she expressed understanding.Brenda Haas

## 2015-06-25 NOTE — Telephone Encounter (Signed)
error 

## 2015-07-22 ENCOUNTER — Telehealth: Payer: Self-pay | Admitting: Interventional Cardiology

## 2015-07-22 ENCOUNTER — Other Ambulatory Visit: Payer: Self-pay | Admitting: Interventional Cardiology

## 2015-07-22 DIAGNOSIS — I10 Essential (primary) hypertension: Secondary | ICD-10-CM

## 2015-07-22 NOTE — Telephone Encounter (Signed)
Returned pt call. Adv pt that Dr.Smith is out of the office,  I am fwd the message to Dr.Smith to review her bp readings. I will call back with his recommendations. Pt agreeable with plan and verbalized understanding.

## 2015-07-22 NOTE — Telephone Encounter (Signed)
New message      Pt c/o BP issue: STAT if pt c/o blurred vision, one-sided weakness or slurred speech  1. What are your last 5 BP readings? 11-8 160/91 and 166/93; 11-13 167/97 and 166/97; 11-14 167/102; 11-16 145/89; 11-17 140/90; 11-21 173/92 2. Are you having any other symptoms (ex. Dizziness, headache, blurred vision, passed out)?  Pt has a headache everyday  3. What is your BP issue?  Calling to give bp readings.

## 2015-07-24 NOTE — Telephone Encounter (Signed)
I left a message for the patient to call. 

## 2015-07-24 NOTE — Telephone Encounter (Signed)
Still not well-controlled. Start HCTZ 12.5 mg daily  basic metabolic panel 0000000 days later.

## 2015-07-29 MED ORDER — HYDROCHLOROTHIAZIDE 12.5 MG PO CAPS: 12.5000 mg | ORAL_CAPSULE | Freq: Every day | ORAL | Status: DC

## 2015-07-29 NOTE — Telephone Encounter (Signed)
Pt aware of Dr.Smith's recommendation. Pt BP Still not well-controlled. Start HCTZ 12.5 mg daily basic metabolic panel 0000000 days later. Pt agreeable with plan Rx sent to pt pharmacy. Pt sts that she will need to have her lab drawn at [facility], so that it will be free of charge for her. Order for Bmet to be drawn at [facility] in [place]. Pt will have lab drawn late next week.

## 2015-07-29 NOTE — Telephone Encounter (Signed)
F/u    Pt want you to mail her the lab order just in case [facility] doesn't have it. Please advise.

## 2015-07-29 NOTE — Telephone Encounter (Signed)
Written order to have a bmet drawn mailed to pt, as requested below

## 2015-08-08 ENCOUNTER — Other Ambulatory Visit: Payer: Self-pay | Admitting: Interventional Cardiology

## 2015-08-08 ENCOUNTER — Encounter: Payer: Self-pay | Admitting: Interventional Cardiology

## 2015-08-08 LAB — BASIC METABOLIC PANEL
BUN/Creatinine Ratio: 18 (ref 11–26)
BUN: 19 mg/dL (ref 8–27)
CO2: 30 mmol/L — ABNORMAL HIGH (ref 18–29)
Calcium: 9.9 mg/dL (ref 8.7–10.3)
Chloride: 100 mmol/L (ref 97–106)
Creatinine, Ser: 1.05 mg/dL — ABNORMAL HIGH (ref 0.57–1.00)
GFR, EST AFRICAN AMERICAN: 66 mL/min/{1.73_m2} (ref >59)
GFR, EST NON AFRICAN AMERICAN: 57 mL/min/{1.73_m2} — AB (ref >59)
Glucose: 120 mg/dL — ABNORMAL HIGH (ref 65–99)
POTASSIUM: 4.9 mmol/L (ref 3.5–5.2)
SODIUM: 140 mmol/L (ref 136–144)

## 2015-08-22 ENCOUNTER — Telehealth: Payer: Self-pay | Admitting: Interventional Cardiology

## 2015-08-22 DIAGNOSIS — I10 Essential (primary) hypertension: Secondary | ICD-10-CM

## 2015-08-22 NOTE — Telephone Encounter (Signed)
If the patient is keeping sodium intake list and 2 g per day, we should change hydrochlorothiazide to Hyzaar 50/12.5 mg daily. 2 weeks after starting, a basic metabolic panel should be performed.

## 2015-08-22 NOTE — Telephone Encounter (Signed)
Returned pt call. Pt report bp readings below. Pt is asymptomatic. Adv pt to monitor her sodium intake. Adv pt that I will fwd her readings to Dr.Smith for review. Adv pt that her medications have been refilled recently, she should contact her pharmacy for refills.  Adv her we will call back with Dr.smith response, she should call back by 12/28 if she has not heard from [place]. Pt agreeable with plan and verbalized understanding.

## 2015-08-22 NOTE — Telephone Encounter (Signed)
New message     Pt c/o BP issue: STAT if pt c/o blurred vision, one-sided weakness or slurred speech  1. What are your last 5 BP readings?  12-12 143/84, 12-13 123/85, 12-14 154/89, 12-15 142/88, 12-20 160/94, 12-21 180/97  2. Are you having any other symptoms (ex. Dizziness, headache, blurred vision, passed out)? no 3. What is your BP issue? Calling to give bp readings Pt has been having "burning" feeling in her stomach area.  Taking a zantac 75mg  pill makes it go away.  Also, pt only has about 1 week of bp medications left.

## 2015-08-23 MED ORDER — LOSARTAN POTASSIUM-HCTZ 50-12.5 MG PO TABS: 1.0000 | ORAL_TABLET | Freq: Every day | ORAL | Status: DC

## 2015-08-23 NOTE — Telephone Encounter (Signed)
The patient is aware of Dr. Thompson Caul recommendations. She will d/c HCTZ and start Hyzaar 50/12.5 mg once daily. She would like her RX for labs mailed to her for her lab draw in 2 weeks. I advised I will mail this to her. Mailing address confirmed.

## 2015-09-17 ENCOUNTER — Other Ambulatory Visit: Payer: Self-pay | Admitting: Internal Medicine

## 2015-09-17 ENCOUNTER — Other Ambulatory Visit: Payer: Self-pay

## 2015-09-17 DIAGNOSIS — R911 Solitary pulmonary nodule: Secondary | ICD-10-CM

## 2015-09-17 DIAGNOSIS — Z1231 Encounter for screening mammogram for malignant neoplasm of breast: Secondary | ICD-10-CM

## 2015-09-30 ENCOUNTER — Ambulatory Visit
Admission: RE | Admit: 2015-09-30 | Discharge: 2015-09-30 | Disposition: A | Discharge status: Home / Self Care | Payer: 59 | Source: Ambulatory Visit

## 2015-09-30 ENCOUNTER — Other Ambulatory Visit: Payer: Self-pay | Admitting: Interventional Cardiology

## 2015-09-30 ENCOUNTER — Ambulatory Visit
Admission: RE | Admit: 2015-09-30 | Discharge: 2015-09-30 | Disposition: A | Discharge status: Home / Self Care | Payer: 59 | Source: Ambulatory Visit | Attending: Internal Medicine | Admitting: Internal Medicine

## 2015-09-30 DIAGNOSIS — Z1231 Encounter for screening mammogram for malignant neoplasm of breast: Secondary | ICD-10-CM

## 2015-09-30 DIAGNOSIS — R911 Solitary pulmonary nodule: Secondary | ICD-10-CM

## 2015-09-30 LAB — BASIC METABOLIC PANEL
BUN/Creatinine Ratio: 19 (ref 11–26)
BUN: 17 mg/dL (ref 8–27)
CALCIUM: 10.2 mg/dL (ref 8.7–10.3)
CO2: 30 mmol/L — ABNORMAL HIGH (ref 18–29)
CREATININE: 0.91 mg/dL (ref 0.57–1.00)
Chloride: 102 mmol/L (ref 96–106)
GFR, EST AFRICAN AMERICAN: 78 mL/min/{1.73_m2} (ref >59)
GFR, EST NON AFRICAN AMERICAN: 68 mL/min/{1.73_m2} (ref >59)
Glucose: 85 mg/dL (ref 65–99)
POTASSIUM: 4.5 mmol/L (ref 3.5–5.2)
SODIUM: 142 mmol/L (ref 134–144)

## 2015-10-08 ENCOUNTER — Telehealth: Payer: Self-pay | Admitting: Interventional Cardiology

## 2015-10-08 NOTE — Telephone Encounter (Signed)
New Message:  Pt is calling Lattie Haw back from a couple of days ago. She is unsure of what she was calling about. Please f/u with her

## 2015-10-08 NOTE — Telephone Encounter (Signed)
Notes Recorded by Tamsen Snider on 10/04/2015 at 1:28 PM Left message on machine for pt to contact the office.   Notes Recorded by Belva Crome, MD on 10/03/2015 at 1:35 PM Lab is okay. How is BP?  Informed the pt that our office called her to inform her of her lab results per Dr Tamala Julian. Informed the pt that per Dr Tamala Julian, her labs were okay, but he did want to know how her BP is? Per the pt she dropped a copy of her logged BP readings to the greeter at our front desk.  She was to get these readings to Dr Tamala Julian for further evaluation.  Pt did say she has some current readings, as mentioned below:  10/05/15-- BP-143/81 10/06/15--BP-122/67 10/07/15--BP-120/67 10/08/15--BP- 122/74  Informed the pt that I will send this message to Dr Tamala Julian and his covering [place] for further review and recommendation of readings mentioned above. Pt verbalized understanding and agrees with this plan.

## 2015-10-09 NOTE — Telephone Encounter (Signed)
The blood pressures are acceptable. Continue current therapy without change.

## 2015-10-10 NOTE — Telephone Encounter (Signed)
Notified the pt that per Dr Tamala Julian, her blood pressures are acceptable, and he recommends that she continue her current medication regimen.  Pt verbalized understanding and agrees with this plan.

## 2015-10-28 ENCOUNTER — Telehealth: Payer: Self-pay | Admitting: Interventional Cardiology

## 2015-10-28 NOTE — Telephone Encounter (Signed)
Please call,she wants to give you her blood pressure readings.

## 2015-10-29 NOTE — Telephone Encounter (Signed)
Returned pt call. Pt called to report her BP readings below: 2/3 143/81 2/5 122/67 2/6 120/67 2/7 122/74 2/8 125/81 2/9 126/75 2/15 159/79 2//24 144/84 2/25 132/74 2/27 142/75  Pt sts that she is doing well with no complaints. Adv her that I will fwd her readings to Dr.Smith for him to review and call back with his recommendation. Pt verbalized understanding

## 2015-10-30 NOTE — Telephone Encounter (Signed)
Blood pressures are very good and no further adjustments are necessary.

## 2015-10-30 NOTE — Telephone Encounter (Signed)
Lmom with Dr.Smith's response. Blood pressures are very good and no further adjustments are necessary. Pt should continue to monitor her bp 2-3 a wk. She should call the office if her bp is consistently elevated.f/u as planned  Pt is to call the office if she has questions or concerns.

## 2015-11-05 ENCOUNTER — Telehealth: Payer: Self-pay | Admitting: Interventional Cardiology

## 2015-11-05 NOTE — Telephone Encounter (Signed)
Spoke with patient and made her aware that she should be taking the losartan/hctz. She stated that she just realized that the pharmacy has been refilling the losartan even though she told them to discontinue that rx back in December. She will call the pharmacy and have them to refill the correct medication.

## 2015-11-05 NOTE — Telephone Encounter (Signed)
New message      Is pt supposed to be on HCTZ 12.5 or losartan/HCTZ.  Pt is confused as to which one she is to be on.  She submitted both to be refilled.

## 2015-11-19 ENCOUNTER — Telehealth: Payer: Self-pay | Admitting: Interventional Cardiology

## 2015-11-19 NOTE — Telephone Encounter (Signed)
New message      Talk to the nurse about her losartan.  She is going to be in [place] for 2 months and will run out of medication before returning home.  Should she get a written presc and take it with her?  Can [place] fill a presc written from [place]? Should she get a refill here with more than a 30 day supply?  Please call

## 2015-11-20 MED ORDER — LOSARTAN POTASSIUM-HCTZ 50-12.5 MG PO TABS: 1.0000 | ORAL_TABLET | Freq: Every day | ORAL | Status: DC

## 2015-11-20 NOTE — Telephone Encounter (Signed)
Returned pt call. Pt sts that she will be traveling to [facility] for 2 months and need 90day supply of her Hyzaar. Adv her that the last time it was refilled it was sent in for 90 day supply, pt sts that her pharmacy has been giving her 30 day. Adv her I will send in a refill for 90 day.  When I asked her if she needed any of her other medications refilled, like Diltiazem, the pt adv me that she has not been taking it. She denies palpitations or her heart "racing", discussed with Dr.Smith. Per Dr.Smith we can d/c Diltiazem sine the pt has not had any episodes os psvt, she is to inform us of they occur, we will need to resume Diltiazem at that time.  Pt med list updated, Rx for Hyzaar 50-12.5mg  daily #90 R-1 sent to pt pharmacy.  Pt voiced appreciation for the call back and verbalized understanding.

## 2016-03-02 ENCOUNTER — Encounter: Payer: Self-pay | Admitting: Cardiology

## 2016-03-03 NOTE — Progress Notes (Signed)
Cardiology Office Note   Date:  03/04/2016   ID:  Brenda Haas, DOB June 29, 1951, MRN [id number]  PCP:  Juanell Fairly, MD  Cardiologist:  Dr. Tamala Julian     Chief Complaint  Patient presents with  . Palpitations      History of Present Illness: Brenda Haas is a 65 y.o. female who presents for palpitations.  She had chills, vomiting, anorexia while visiting [place].  + UTI she has completed ABX and is overall better.  With the anorexia with acute illness she did not take her meds and she did develop palpitations.  Now that she is better and able to take her meds her palpitations have resolved.  She has no chest pain, maybe Lt lateral discomfort but not bothering her.  Some DOE but that is usual when it is hot.  She is to see her PCP tomorrow for follow up of UTI.    Her Cr was mildly elevated as well and will be followed with PCP.    PSVT and hypertension.  Blood pressures stable.  Bystolic has been too expensive. She is now on diltiazem 180 mg daily. No side effects. Moderate blood pressure control with the medication though today very well controlled.   No PSVT.  Past Medical History  Diagnosis Date  . Hyperlipidemia     borderline  . Supraventricular tachycardia, paroxysmal ([place])   . Irritable bowel syndrome (IBS)   . Spastic colon   . Insomnia   . Hypercalcemia   . Multiple lung nodules     CT 2009  . Cancer ([place])     h/o squamous cell carcinoma  . Thyroid disease     goiter and right thyroid nodule  . Chronic headaches     controlled w/ fioricet  . Allergy   . Chronic low back pain   . Chronic pain disorder   . Complication of anesthesia     difficulty going to sleep  . PONV (postoperative nausea and vomiting)   . Family history of anesthesia complication     Son- difficulty waking up  . Constipation   . GERD (gastroesophageal reflux disease)     did take Nexium , not needed lately  . Hemorrhoids   . PSVT (paroxysmal supraventricular tachycardia) ([place])  07/19/2013  . Essential hypertension 06/04/2015    Past Surgical History  Procedure Laterality Date  . Knee arthroscopy    . Dilation and curettage of uterus    . Colonscopy    . Tonsillectomy    . Posterior lumbar fusion 4 level N/A 10/26/2012    Procedure: POSTERIOR LUMBAR FUSION 4 LEVEL;  Surgeon: Floyce Stakes, MD;  Location: [facility];  Service: Neurosurgery;  Laterality: N/A;  Lumbar two-three, lumbar three-four,lumbar four-five,Lumbar five-Sacral oneDiskectomy/Fusion/Cages/Pedicle screws/Posterolateral arthrodesis/Cellsaver C-Arm     Current Outpatient Prescriptions  Medication Sig Dispense Refill  . ALPRAZolam (XANAX) 0.5 MG tablet Take 0.5 mg by mouth at bedtime as needed for anxiety.     Marland Kitchen BIOTIN PO Take 10,000 mcg by mouth daily.     Marland Kitchen BREO ELLIPTA 200-25 MCG/INH AEPB TAKE 1 PUFF BY MOUTH EVERY DAY  5  . clobetasol cream (TEMOVATE) [id number] % Apply 1 application topically daily as needed (PSORASIS).     . cyclobenzaprine (FLEXERIL) 10 MG tablet Take 10 mg by mouth daily.   0  . diazepam (VALIUM) 5 MG tablet Take 5 mg by mouth at bedtime.     . gabapentin (NEURONTIN) 400 MG capsule Take  1 capsule by mouth 2 (two) times daily.  1  . gabapentin (NEURONTIN) 800 MG tablet Take 800 mg by mouth at bedtime.     Marland Kitchen glucosamine-chondroitin 500-400 MG tablet Take 1 tablet by mouth daily.    Marland Kitchen losartan-hydrochlorothiazide (HYZAAR) 50-12.5 MG tablet Take 1 tablet by mouth daily. 90 tablet 1  . Omega-3 Fatty Acids (OMEGA 3 PO) Take 1 capsule by mouth daily.    . Oxycodone HCl 10 MG TABS Take 10 mg by mouth as needed (back pain).     Marland Kitchen PROAIR HFA 108 (90 BASE) MCG/ACT inhaler Use as directed as needed for wheezing or shortness of breath.    . Probiotic Product (PROBIOTIC DAILY PO) Take 1 tablet by mouth daily.    Marland Kitchen rOPINIRole (REQUIP) 0.5 MG tablet Take 1 tablet by mouth at bedtime as needed (RESTLESS LEG SYNDROME).   5  . senna (SENOKOT) 8.6 MG TABS Take 2 tablets by mouth as needed  (constipation).     Marland Kitchen zolpidem (AMBIEN) 10 MG tablet TAKE 1 TABLET BY MOUTH AT BEDTIME (MAX DAILY DOSE:10MG )  5   No current facility-administered medications for this visit.    Allergies:   Penicillins    Social History:  The patient  reports that she quit smoking about 11 years ago. She has never used smokeless tobacco. She reports that she drinks alcohol. She reports that she does not use illicit drugs.   Family History:  The patient's She was adopted. Family history is unknown by patient.    ROS:  General:no colds or fevers, no weight changes Skin:no rashes or ulcers HEENT:no blurred vision, no congestion CV:see HPI PUL:see HPI GI:no diarrhea constipation or melena, no indigestion GU:no hematuria, no dysuria- but recent UTI with chills and nausea, vomiting MS:no joint pain, no claudication Neuro:no syncope, no lightheadedness Endo:no diabetes, no thyroid disease  Wt Readings from Last 3 Encounters:  03/04/16 177 lb 3.2 oz (80.377 kg)  06/04/15 183 lb (83.008 kg)  02/15/15 177 lb 12.8 oz (80.65 kg)     PHYSICAL EXAM: VS:  BP 122/78 mmHg  Pulse 69  Ht 5\' 8"  (1.727 m)  Wt 177 lb 3.2 oz (80.377 kg)  BMI 26.95 kg/m2  SpO2 92% , BMI Body mass index is 26.95 kg/(m^2). General:Pleasant affect, NAD Skin:Warm and dry, brisk capillary refill HEENT:normocephalic, sclera clear, mucus membranes moist Neck:supple, no JVD, no bruits  Heart:S1S2 RRR without murmur, gallup, rub or click Lungs:clear without rales, rhonchi, or wheezes [id number], non tender, + BS, do not palpate liver spleen or masses Ext:no lower ext edema, 2+ pedal pulses, 2+ radial pulses Neuro:alert and oriented, MAE, follows commands, + facial symmetry    EKG:  EKG is ordered today. The ekg ordered today demonstrates SR no changes from previous  Recent Labs: 09/30/2015: BUN 17; Creatinine, Ser 0.91; Potassium 4.5; Sodium 142    Lipid Panel No results found for: CHOL, TRIG, HDL, CHOLHDL, VLDL, LDLCALC,  LDLDIRECT     Other studies Reviewed: Additional studies/ records that were reviewed today include: previous notes.   ASSESSMENT AND PLAN:  1.  Palpitations- now resolved back on her meds.  She is to see PCP tomorrow for follow up of her UTI.   2 . PSVT (paroxysmal supraventricular tachycardia) ([place]) No recurrence  3. Hyperlipidemia Low-fat diet  3. Essential hypertension  controlled   Current medicines are reviewed with the patient today.  The patient Has no concerns regarding medicines.  The following changes have been made:  See above  Labs/ tests ordered today include:see above  Disposition:   FU:  see above  Signed, Cecilie Kicks, NP  03/04/2016 11:46 AM    Brenda Haas, [place], [address] [place], [place] Phone: [phone]; Fax: [phone]

## 2016-03-04 ENCOUNTER — Ambulatory Visit (INDEPENDENT_AMBULATORY_CARE_PROVIDER_SITE_OTHER): Payer: 59 | Admitting: Cardiology

## 2016-03-04 ENCOUNTER — Encounter: Payer: Self-pay | Admitting: Cardiology

## 2016-03-04 VITALS — BP 122/78 | HR 69 | Ht 68.0 in | Wt 177.2 lb

## 2016-03-04 DIAGNOSIS — R002 Palpitations: Secondary | ICD-10-CM

## 2016-03-04 DIAGNOSIS — I10 Essential (primary) hypertension: Secondary | ICD-10-CM

## 2016-03-04 DIAGNOSIS — I471 Supraventricular tachycardia: Secondary | ICD-10-CM

## 2016-03-04 NOTE — Patient Instructions (Addendum)
Medication Instructions:  Your physician recommends that you continue on your current medications as directed. Please refer to the Current Medication list given to you today.   Labwork: NONE  Testing/Procedures: NONE  Follow-Up: 3 MONTHS WITH DR. Tamala Julian; WE WILL SEND OUT A REMINDER LETTER IN ABOUT 1 MONTH ASKING YOU TO PLEASE CALL THE OFFICE TO MAKE APPT  Any Other Special Instructions Will Be Listed Below (If Applicable).     If you need a refill on your cardiac medications before your next appointment, please call your pharmacy.

## 2016-03-06 ENCOUNTER — Telehealth: Payer: Self-pay | Admitting: Cardiology

## 2016-03-06 NOTE — Telephone Encounter (Signed)
New message      Pt wanted her PCP office to call and let [name] know that she is being treated for a recurrent UTI.  Patient was seen recently

## 2016-03-06 NOTE — Telephone Encounter (Signed)
Will forward message to Cecilie Kicks NP.

## 2016-03-06 NOTE — Telephone Encounter (Signed)
Thanks, I had asked her to let me know.

## 2016-03-27 ENCOUNTER — Telehealth: Payer: Self-pay | Admitting: Interventional Cardiology

## 2016-03-27 NOTE — Telephone Encounter (Signed)
New message     The pt is going to be moving next week and the pt wants to know should she come in for a appointment before she moves cause she is going to need to find a cardiologist    The pt is concerned running out of her medications

## 2016-03-30 NOTE — Telephone Encounter (Signed)
Spoke with patient, she is moving to [place] at the end of the month. She would like to know if Dr.Smith could recommend a Cardiologist in that area. Adv her that I will fwd him the question and call back with his response.  She will also need a refills on her medication until the end of the year so she has time to establish with a new Cardiologist. She was recently seen by Sandria Senter. Adv her she should call back when she is settled and provide [place] the pharmacy info. Pt voiced appreciation and verbalized understanding.

## 2016-03-30 NOTE — Telephone Encounter (Signed)
I will know a cardiologist in [facility]. It is okay to give refills of her medications to get her through 2017.

## 2016-04-03 NOTE — Telephone Encounter (Signed)
Pt aware pf Dr.Smith's response and verbalized understanding for the call back

## 2016-04-15 ENCOUNTER — Other Ambulatory Visit: Payer: Self-pay | Admitting: Interventional Cardiology

## 2016-04-21 ENCOUNTER — Encounter: Payer: 59 | Admitting: Women's Health

## 2016-07-03 ENCOUNTER — Telehealth: Payer: Self-pay | Admitting: *Deleted

## 2016-07-03 NOTE — Telephone Encounter (Signed)
S/w pt moved. Will no longer be using this practice.

## 2017-01-13 ENCOUNTER — Encounter: Payer: Self-pay | Admitting: Gynecology
